# Patient Record
Sex: Female | Born: 1975 | Race: White | Hispanic: No | Marital: Married | Smoking: Never smoker
Health system: Southern US, Community
[De-identification: ages and names within clinical notes are randomized; demographics above are authoritative.]

## PROBLEM LIST (undated history)

## (undated) DIAGNOSIS — M533 Sacrococcygeal disorders, not elsewhere classified: Secondary | ICD-10-CM

## (undated) DIAGNOSIS — M549 Dorsalgia, unspecified: Secondary | ICD-10-CM

## (undated) HISTORY — DX: Sacrococcygeal disorders, not elsewhere classified: M53.3

## (undated) HISTORY — DX: Dorsalgia, unspecified: M54.9

---

## 2005-04-12 ENCOUNTER — Ambulatory Visit: Payer: Self-pay | Admitting: Sports Medicine

## 2006-04-22 ENCOUNTER — Emergency Department (HOSPITAL_COMMUNITY): Admission: EM | Admit: 2006-04-22 | Discharge: 2006-04-22 | Payer: Self-pay | Admitting: Family Medicine

## 2006-09-10 ENCOUNTER — Ambulatory Visit: Payer: Self-pay | Admitting: Family Medicine

## 2007-01-15 ENCOUNTER — Ambulatory Visit: Payer: Self-pay | Admitting: Family Medicine

## 2007-08-20 ENCOUNTER — Other Ambulatory Visit: Admission: RE | Admit: 2007-08-20 | Discharge: 2007-08-20 | Payer: Self-pay | Admitting: Obstetrics and Gynecology

## 2008-07-14 ENCOUNTER — Ambulatory Visit (HOSPITAL_COMMUNITY): Admission: RE | Admit: 2008-07-14 | Discharge: 2008-07-14 | Payer: Self-pay | Admitting: Chiropractic Medicine

## 2009-07-11 ENCOUNTER — Ambulatory Visit: Payer: Self-pay | Admitting: Sports Medicine

## 2009-07-11 DIAGNOSIS — M25519 Pain in unspecified shoulder: Secondary | ICD-10-CM

## 2009-07-11 DIAGNOSIS — M719 Bursopathy, unspecified: Secondary | ICD-10-CM

## 2009-07-11 DIAGNOSIS — M67919 Unspecified disorder of synovium and tendon, unspecified shoulder: Secondary | ICD-10-CM | POA: Insufficient documentation

## 2010-05-08 NOTE — Assessment & Plan Note (Signed)
Summary: R SHOULDER PAIN X 1 WK   Vital Signs:  Patient profile:   35 year old female Height:      65 inches Weight:      112 pounds BMI:     18.71 BP sitting:   119 / 76  Vitals Entered By: Lillia Pauls CMA (July 11, 2009 9:38 AM)  History of Present Illness: Pt presents with right shoulder pain that has been bothering her for the past week. She has a history of frozen shoulder and wants to make sure that she is not digressing back into having a frozen shoulder. She feels a "buzzing" feeling over her lateral shoulder in the deltoid medicine, especially in the morning when she wakes up. She is able to sleep well at night. Pain does radiation throughout her shoulder, especially when she does certain yoga positions. She is a Marine scientist. She also feels that her right shoulderblade moves more than her left one. She has taken aspirin once for the pain which was helpful. She has also iced her shoulder which was also helpful. She denies any numbness or tingling in her hands or shoulder.  Physical Exam  General:  alert and well-developed.   Head:  normocephalic and atraumatic.   Neck:  supple and full ROM.  No TTP over her cervical spine.  Lungs:  normal respiratory effort.   Msk:  Right Shoulder: Normal inspection Full ROM without pain or difficulty Neg drop arm test 5/5 strength with resisted internal and external rotation Neg empty can test, Hawkin's,cross over test, Neer's test and speed's test + TTP over her biceps tendon The distal portion of her medial scapula moves easily and has weakness No winging of her scapula  Left Shoulder: Normal inspection Full ROM without pain or difficulty Neg drop arm test 5/5 strength with resisted internal and external rotation Neg empty can test, Hawkin's,cross over test, Neer's test and speed's test No TTP throughout No winging of her scapula  Bighton score of 9/9   Impression & Recommendations:  Problem # 1:  SHOULDER PAIN  (ICD-719.41) Likley due to scapular instability and rotator cuff tendinitis 1. Given shoulder exercises to do daily including sacpular stabilizing exercises 2. Can use OTC NSAIDs as needed for pain 3. Can ice as needed for 20 minutes 4. 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 See above for treatment plan

## 2010-05-10 IMAGING — CR DG CERVICAL SPINE COMPLETE 4+V
6 series · 6 of 6 positions shown · non-contrast
Comparison: None.

CLINICAL DATA: Neck pain

CERVICAL SPINE - COMPLETE 4+ VIEW

[w c-spine lat *]
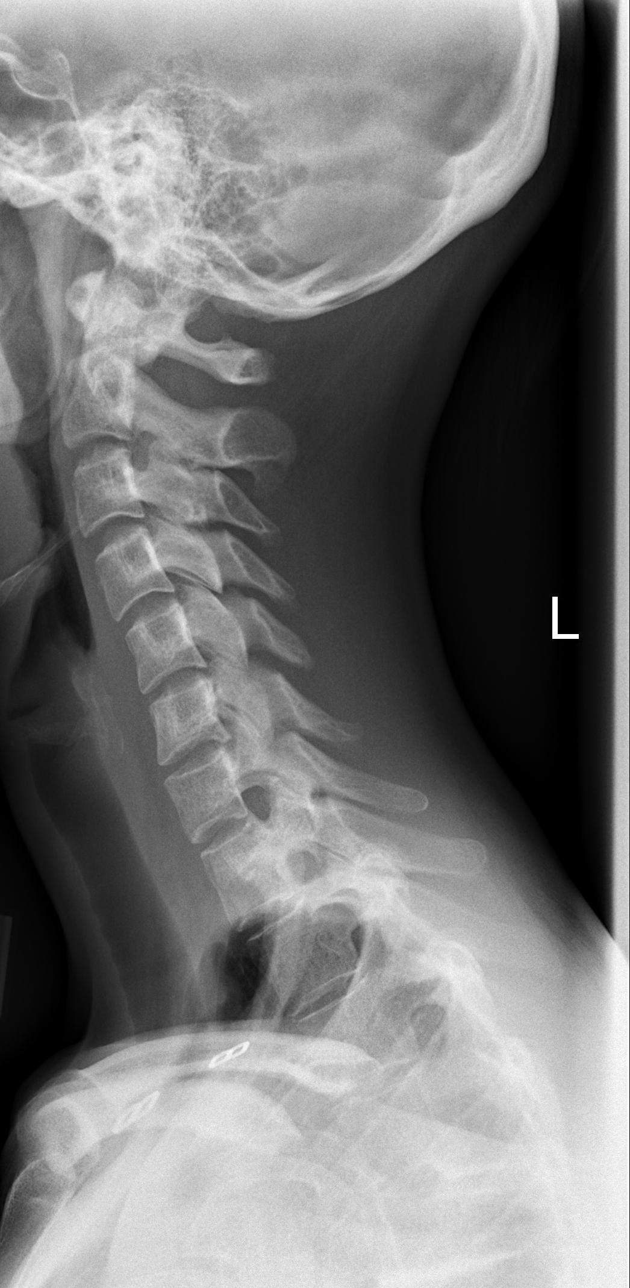

[w c-spine oblique (1 of 2)]
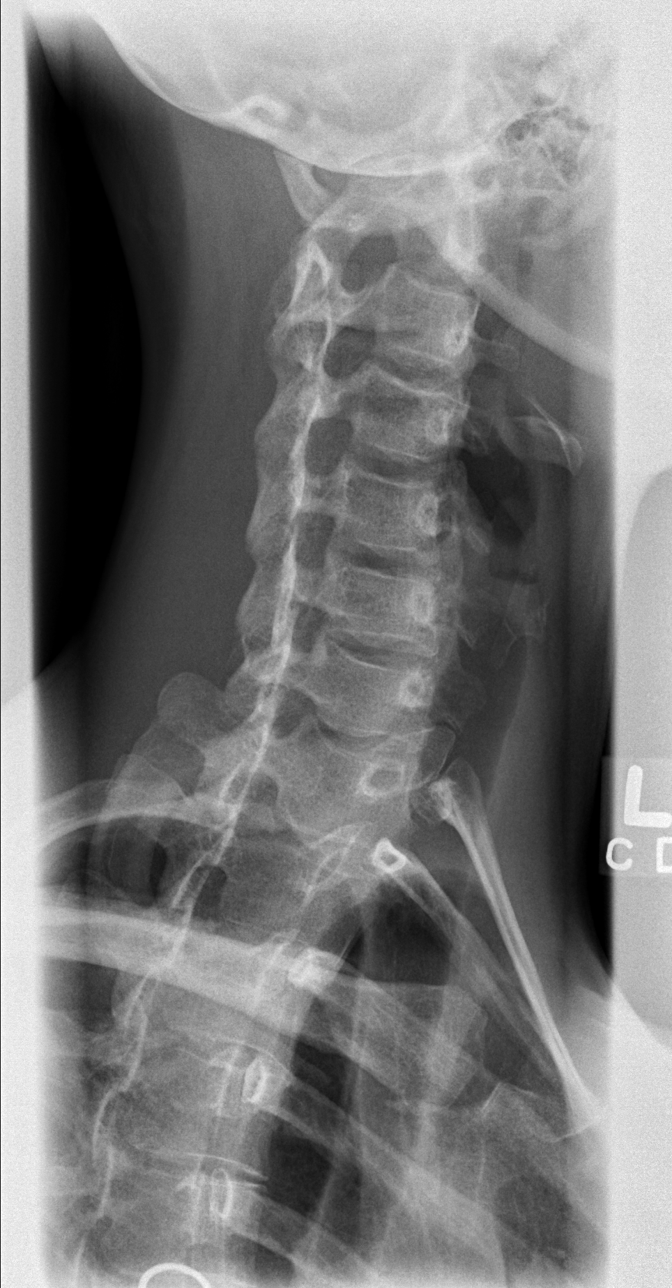

[w c-spine oblique (2 of 2)]
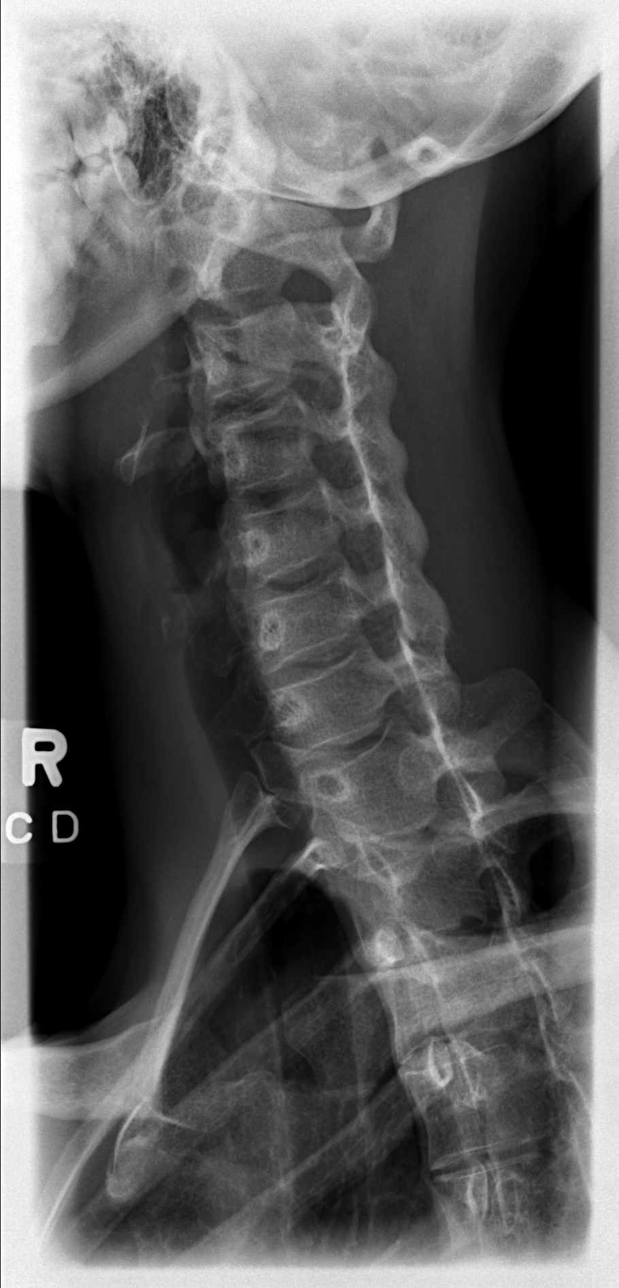

[w c-spine a.p. *]
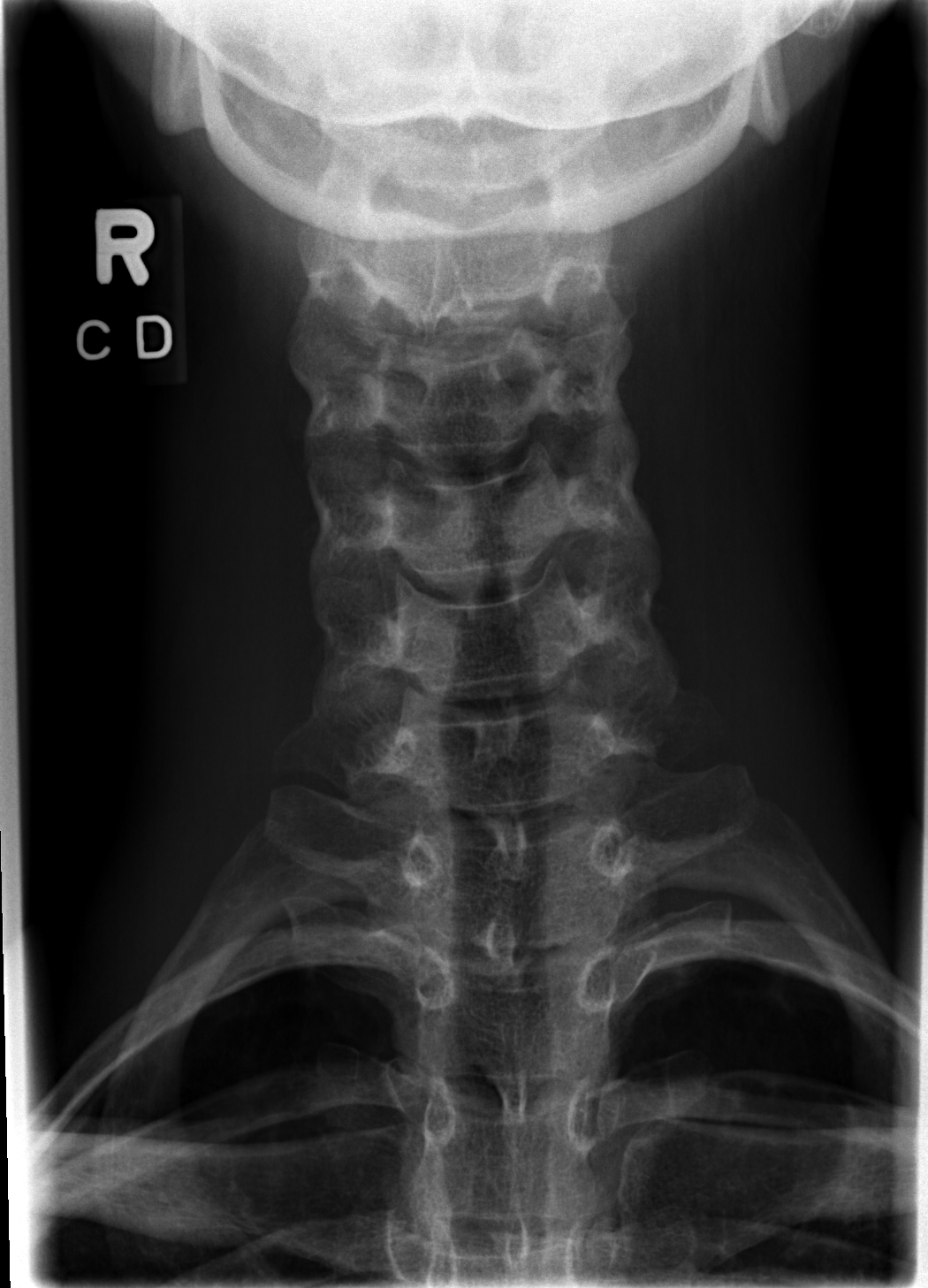

[w c-spine odontoid * (1 of 2)]
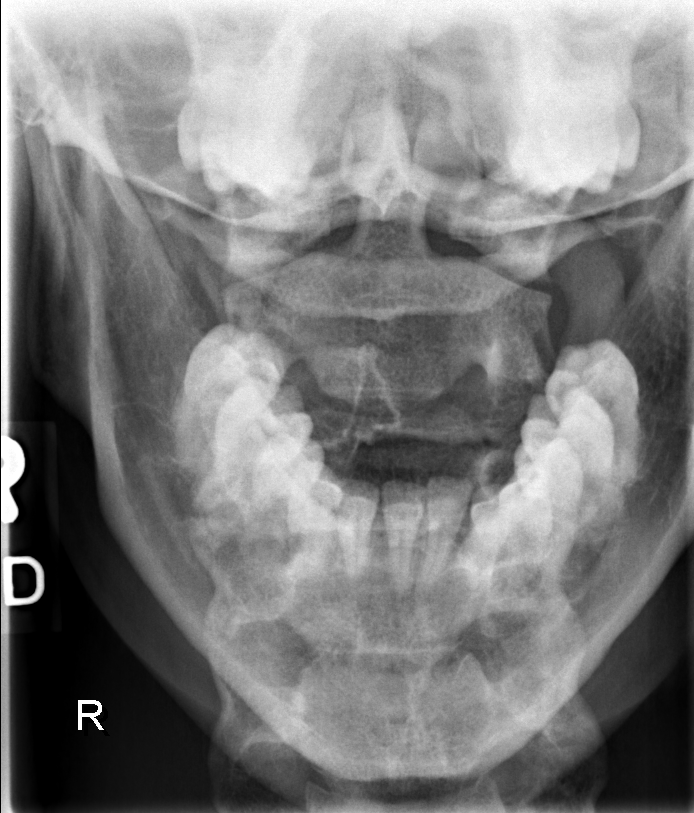

[w c-spine odontoid * (2 of 2)]
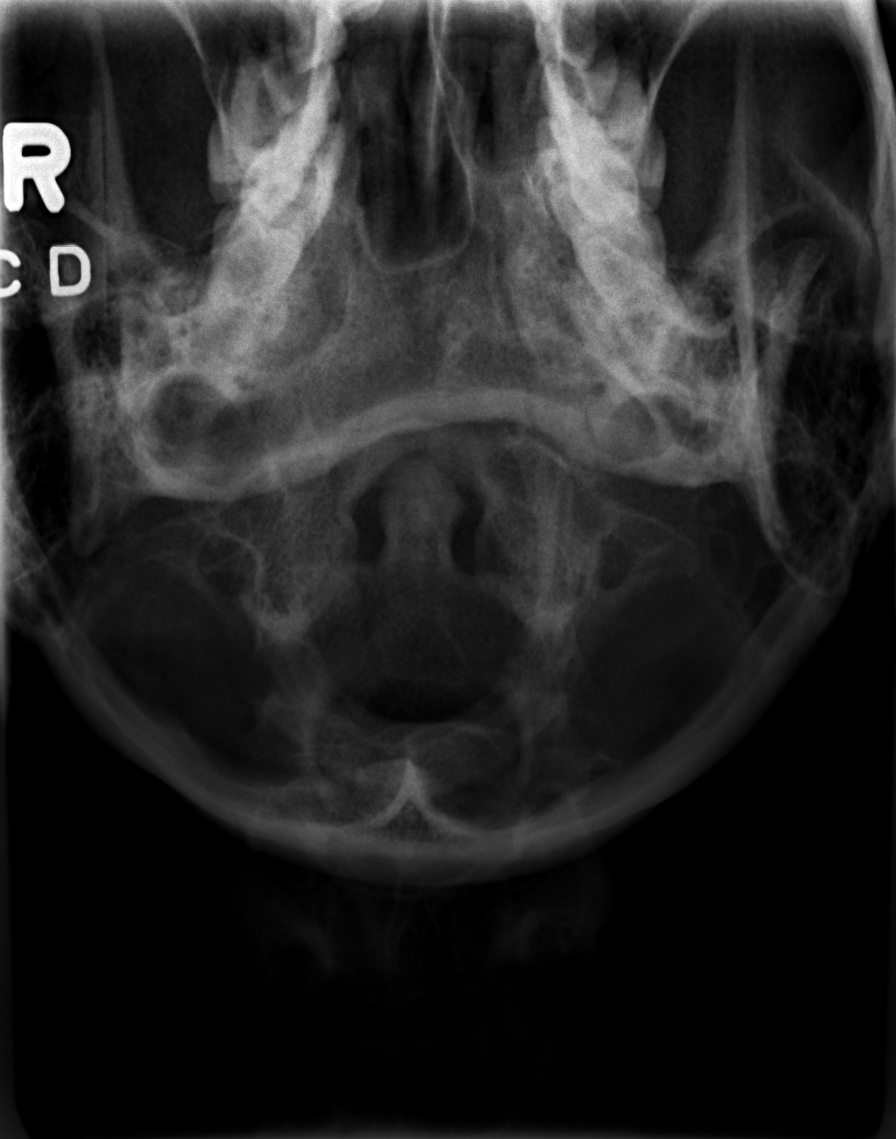

[6 of 6 positions shown; findings below may reference images not displayed]

FINDINGS: Mild degenerative change appears to be present at C6-7
where there is slight reversal of the normal cervical lordotic
curve, perhaps minimal retrolisthesis, and question of early
posterior osteophyte formation.  There is minimal neural foraminal
narrowing on the right.  No definite facet arthropathy is seen.
There is no prevertebral soft tissue swelling.  AP and odontoid
views are unremarkable.
IMPRESSION: Question mild degenerative change at C6-7.  Consider MRI for
further evaluation if there are no clinical contraindications.

## 2011-01-24 ENCOUNTER — Other Ambulatory Visit (HOSPITAL_COMMUNITY)
Admission: RE | Admit: 2011-01-24 | Discharge: 2011-01-24 | Disposition: A | Discharge status: Home / Self Care | Payer: BC Managed Care – PPO | Source: Ambulatory Visit | Attending: Family Medicine | Admitting: Family Medicine

## 2011-01-24 ENCOUNTER — Ambulatory Visit (INDEPENDENT_AMBULATORY_CARE_PROVIDER_SITE_OTHER): Payer: BC Managed Care – PPO | Admitting: Family Medicine

## 2011-01-24 ENCOUNTER — Encounter: Payer: Self-pay | Admitting: Family Medicine

## 2011-01-24 VITALS — BP 102/70 | HR 72 | Ht 65.0 in | Wt 115.0 lb

## 2011-01-24 DIAGNOSIS — N979 Female infertility, unspecified: Secondary | ICD-10-CM | POA: Insufficient documentation

## 2011-01-24 DIAGNOSIS — Z23 Encounter for immunization: Secondary | ICD-10-CM

## 2011-01-24 DIAGNOSIS — Z13220 Encounter for screening for lipoid disorders: Secondary | ICD-10-CM

## 2011-01-24 DIAGNOSIS — Z01419 Encounter for gynecological examination (general) (routine) without abnormal findings: Secondary | ICD-10-CM | POA: Insufficient documentation

## 2011-01-24 DIAGNOSIS — R5381 Other malaise: Secondary | ICD-10-CM

## 2011-01-24 DIAGNOSIS — Z0000 Encounter for general adult medical examination without abnormal findings: Secondary | ICD-10-CM

## 2011-01-24 DIAGNOSIS — R5383 Other fatigue: Secondary | ICD-10-CM

## 2011-01-24 HISTORY — DX: Female infertility, unspecified: N97.9

## 2011-01-24 LAB — POCT URINALYSIS DIPSTICK
Bilirubin, UA: NEGATIVE
Ketones, UA: NEGATIVE
Leukocytes, UA: NEGATIVE
Nitrite, UA: NEGATIVE
Protein, UA: NEGATIVE

## 2011-01-24 LAB — CBC WITH DIFFERENTIAL/PLATELET
Basophils Absolute: 0 10*3/uL (ref 0.0–0.1)
Basophils Relative: 1 % (ref 0–1)
Eosinophils Absolute: 0.1 10*3/uL (ref 0.0–0.7)
Lymphs Abs: 1.8 10*3/uL (ref 0.7–4.0)
MCH: 31.2 pg (ref 26.0–34.0)
Neutrophils Relative %: 44 % (ref 43–77)
Platelets: 266 10*3/uL (ref 150–400)
RBC: 4.2 MIL/uL (ref 3.87–5.11)
RDW: 12.6 % (ref 11.5–15.5)

## 2011-01-24 LAB — COMPREHENSIVE METABOLIC PANEL
ALT: 8 U/L (ref 0–35)
AST: 12 U/L (ref 0–37)
Alkaline Phosphatase: 35 U/L — ABNORMAL LOW (ref 39–117)
CO2: 25 mEq/L (ref 19–32)
Sodium: 138 mEq/L (ref 135–145)
Total Bilirubin: 0.8 mg/dL (ref 0.3–1.2)
Total Protein: 7.3 g/dL (ref 6.0–8.3)

## 2011-01-24 LAB — LIPID PANEL
LDL Cholesterol: 101 mg/dL — ABNORMAL HIGH (ref 0–99)
Total CHOL/HDL Ratio: 2.7 Ratio
VLDL: 9 mg/dL (ref 0–40)

## 2011-01-24 LAB — TSH: TSH: 1.984 u[IU]/mL (ref 0.350–4.500)

## 2011-01-24 NOTE — Progress Notes (Signed)
Alexandra Sutton is a 35 y.o. female who presents for a complete physical.  She has the following concerns: She has been trying to get pregnant for a year and a half.  Started checking her basal body temperatures.  Husband has talked to his doctor and plans to have sperm checked.  Admits to not having sex very frequently. She has family history of overactive thyroid and would like thyroid checked today.  Periods are regular  There is no immunization history on file for this patient. Last tetanus was likely in 2003 when in [employer] Last Pap smear: 1 year ago Last mammogram: never Last colonoscopy: never Last DEXA: never Dentist: twice yearly Ophtho: yearly Exercise: cardio twice a week, rides bike or walks to work, teaches yoga  Past Medical History  Diagnosis Date  . SI (sacroiliac) joint dysfunction     History reviewed. No pertinent past surgical history.  History   Social History  . Marital Status: Married    Spouse Name: N/A    Number of Children: N/A  . Years of Education: N/A   Occupational History  . Not on file.   Social History Main Topics  . Smoking status: Never Smoker   . Smokeless tobacco: Never Used  . Alcohol Use: Yes     2-3 drinks/week  . Drug Use: Yes     marijuana once monthly  . Sexually Active: Yes -- Female partner(s)   Other Topics Concern  . 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 Thyroid disease Mother   . Cancer Mother     kidney and lung  . Fibromyalgia Father   . Thyroid disease Maternal Uncle   . Thyroid disease Maternal Grandmother   . Diabetes Neg Hx   . Heart disease Neg Hx   . Depression Sister    No current outpatient prescriptions on file.  No Known Allergies  ROS: The patient denies anorexia, fever, weight changes, headaches,  vision changes, decreased hearing, ear pain,  sore throat, breast concerns, chest pain, palpitations, dizziness, syncope, dyspnea on exertion, cough, swelling, nausea, vomiting, diarrhea, constipation, abdominal pain, melena, hematochezia, indigestion/heartburn, hematuria, incontinence, dysuria, irregular menstrual cycles, vaginal discharge, odor or itch, genital lesions, joint pains, numbness, tingling, weakness, tremor, suspicious skin lesions, depression, anxiety, abnormal bleeding/bruising, or enlarged lymph nodes.  Uses accupuncture frequently for any concerns  PHYSICAL EXAM: BP 102/70  Pulse 72  Ht 5\' 5"  (1.651 m)  Wt 115 lb (52.164 kg)  BMI 19.14 kg/m2  General Appearance:    Alert, cooperative, no distress, appears stated age  Head:    Normocephalic, without obvious abnormality, atraumatic  Eyes:    PERRL, conjunctiva/corneas clear, EOM's intact, fundi    benign  Ears:    Normal TM's and external ear canals  Nose:   Nares normal, mucosa normal, no drainage or sinus   tenderness  Throat:   Lips, mucosa, and tongue normal; teeth and gums normal  Neck:   Supple, no lymphadenopathy;  thyroid:  no   enlargement/tenderness/nodules; no carotid   bruit or JVD  Back:    Spine nontender, no curvature, ROM normal, no CVA     tenderness  Lungs:     Clear to auscultation bilaterally without wheezes, rales or     ronchi; respirations unlabored  Chest Wall:    No tenderness or deformity  Heart:    Regular rate and rhythm, S1 and S2 normal, no murmur, rub   or gallop  Breast Exam:    No tenderness, masses, or nipple discharge or inversion.      No axillary lymphadenopathy  Abdomen:     Soft, non-tender, nondistended, normoactive bowel sounds,    no masses, no hepatosplenomegaly  Genitalia:    Normal external genitalia without lesions.  BUS and vagina normal; cervix without lesions, or cervical motion tenderness. No abnormal vaginal discharge.  Uterus and adnexa not enlarged, nontender, no masses.  Pap performed  Rectal:    Not performed due  to age<40 and no related complaints  Extremities:   No clubbing, cyanosis or edema  Pulses:   2+ and symmetric all extremities  Skin:   Skin color, texture, turgor normal, no rashes or lesions  Lymph nodes:   Cervical, supraclavicular, and axillary nodes normal  Neurologic:   CNII-XII intact, normal strength, sensation and gait; reflexes 2+ and symmetric throughout          Psych:   Normal mood, affect, hygiene and grooming.    ASSESSMENT/PLAN: 1. Physical exam, routine  POCT Urinalysis Dipstick, Cytology - PAP  2. Infertility, female  TSH  3. Screening for lipoid disorders  Lipid panel  4. Other malaise and fatigue  Vitamin D 25 hydroxy, CBC with Differential, Comprehensive metabolic panel  5. Need for Tdap vaccination  Tdap vaccine greater than or equal to 7yo IM   Discussed monthly self breast exams and yearly mammograms after the age of 3; at least 30 minutes of aerobic activity at least 5 days/week; proper sunscreen use reviewed; healthy diet, including goals of calcium and vitamin D intake and alcohol recommendations (less than or equal to 1 drink/day) reviewed; regular seatbelt use; changing batteries in smoke detectors.  Immunization recommendations discussed--TdaP give.  Flu shot encouraged but pt declined.  Colonoscopy recommendations reviewed--age 33

## 2011-01-24 NOTE — Patient Instructions (Addendum)
HEALTH MAINTENANCE RECOMMENDATIONS:  It is recommended that you get at least 30 minutes of aerobic exercise at least 5 days/week (for weight loss, you may need as much as 60-90 minutes). This can be any activity that gets your heart rate up. This can be divided in 10-15 minute intervals if needed, but try and build up your endurance at least once a week.  Weight bearing exercise is also recommended twice weekly.  Eat a healthy diet with lots of vegetables, fruits and fiber.  "Colorful" foods have a lot of vitamins (ie green vegetables, tomatoes, red peppers, etc).  Limit sweet tea, regular sodas and alcoholic beverages, all of which has a lot of calories and sugar.  Up to 1 alcoholic drink daily may be beneficial for women (unless trying to lose weight, watch sugars).  Drink a lot of water.  Calcium recommendations are 1200-1500 mg daily (1500 mg for postmenopausal women or women without ovaries), and vitamin D 1000 IU daily.  This should be obtained from diet and/or supplements (vitamins), and calcium should not be taken all at once, but in divided doses.  Monthly self breast exams and yearly mammograms for women over the age of 72 is recommended.  Sunscreen of at least SPF 30 should be used on all sun-exposed parts of the skin when outside between the hours of 10 am and 4 pm (not just when at beach or pool, but even with exercise, golf, tennis, and yard work!)  Use a sunscreen that says "broad spectrum" so it covers both UVA and UVB rays, and make sure to reapply every 1-2 hours.  Remember to change the batteries in your smoke detectors when changing your clock times in the spring and fall.  Use your seat belt every time you are in a car, and please drive safely and not be distracted with cell phones and texting while driving.  I recommend that you follow up with a gynecologist for further evaluation of infertility (pending your husband's evaluation)

## 2011-01-25 NOTE — Progress Notes (Signed)
Advised pt of labs & vit d

## 2011-01-28 ENCOUNTER — Encounter: Payer: Self-pay | Admitting: Family Medicine

## 2011-11-07 DIAGNOSIS — M549 Dorsalgia, unspecified: Secondary | ICD-10-CM | POA: Insufficient documentation

## 2011-11-07 HISTORY — DX: Dorsalgia, unspecified: M54.9

## 2014-01-31 ENCOUNTER — Encounter: Payer: Self-pay | Admitting: Family Medicine

## 2014-01-31 ENCOUNTER — Ambulatory Visit (INDEPENDENT_AMBULATORY_CARE_PROVIDER_SITE_OTHER): Payer: BC Managed Care – PPO | Admitting: Family Medicine

## 2014-01-31 VITALS — BP 118/70 | HR 80 | Ht 65.0 in | Wt 117.0 lb

## 2014-01-31 DIAGNOSIS — M62838 Other muscle spasm: Secondary | ICD-10-CM

## 2014-01-31 DIAGNOSIS — M542 Cervicalgia: Secondary | ICD-10-CM

## 2014-01-31 DIAGNOSIS — M6248 Contracture of muscle, other site: Secondary | ICD-10-CM

## 2014-01-31 MED ORDER — NAPROXEN 500 MG PO TABS: 500.0000 mg | ORAL_TABLET | Freq: Two times a day (BID) | ORAL | Status: DC

## 2014-01-31 MED ORDER — CYCLOBENZAPRINE HCL 10 MG PO TABS: 5.0000 mg | ORAL_TABLET | Freq: Three times a day (TID) | ORAL | Status: DC | PRN

## 2014-01-31 NOTE — Patient Instructions (Signed)
Take your prescribed anti-inflammatory medication with food; discontinue or cut back the dose if you develop stomach pain/discomfort/side effects.  Do not take other over-the-counter pain medications such as ibuprofen, advil, motrin, aleve, naproxen at the same time.  Do not use longer than recommended.  It is okay to use acetaminophen (tylenol) along with this medication.  Take the muscle relaxant just at bedtime, as it is likely very sedating.  Start with 1/2 tablet, and if ineffective, you can increase to the full tablet.  Torticollis, Acute You have suddenly (acutely) developed a twisted neck (torticollis). This is usually a self-limited condition. CAUSES  Acute torticollis may be caused by malposition, trauma or infection. Most commonly, acute torticollis is caused by sleeping in an awkward position. Torticollis may also be caused by the flexion, extension or twisting of the neck muscles beyond their normal position. Sometimes, the exact cause may not be known. SYMPTOMS  Usually, there is pain and limited movement of the neck. Your neck may twist to one side. DIAGNOSIS  The diagnosis is often made by physical examination. X-rays, CT scans or MRIs may be done if there is a history of trauma or concern of infection. TREATMENT  For a common, stiff neck that develops during sleep, treatment is focused on relaxing the contracted neck muscle. Medications (including shots) may be used to treat the problem. Most cases resolve in several days. Torticollis usually responds to conservative physical therapy. If left untreated, the shortened and spastic neck muscle can cause deformities in the face and neck. Rarely, surgery is required. HOME CARE INSTRUCTIONS   Use over-the-counter and prescription medications as directed by your caregiver.  Do stretching exercises and massage the neck as directed by your caregiver.  Follow up with physical therapy if needed and as directed by your caregiver. SEEK  IMMEDIATE MEDICAL CARE IF:   You develop difficulty breathing or noisy breathing (stridor).  You drool, develop trouble swallowing or have pain with swallowing.  You develop numbness or weakness in the hands or feet.  You have changes in speech or vision.  You have problems with urination or bowel movements.  You have difficulty walking.  You have a fever.  You have increased pain. MAKE SURE YOU:   Understand these instructions.  Will watch your condition.  Will get help right away if you are not doing well or get worse. Document Released: 03/22/2000 Document Revised: 06/17/2011 Document Reviewed: 05/03/2009 [facility] Patient Information 2015 [place], [place]. This information is not intended to replace advice given to you by your health care provider. Make sure you discuss any questions you have with your health care provider.

## 2014-01-31 NOTE — Progress Notes (Signed)
Chief Complaint  Patient presents with  . Neck Pain    started Oct 14th, gets slightly better as the day goes on but when she awakens in the morning it is at its worst. Pain is on the right side of her neck and down her shoulder. Patient declined flu vaccine.    After returning from trip to [place] on 10/13, the following morning she woke up with her right side of her neck "frozen".  She wasn't able to turn to the right, had pain with all movements.  She has had this in the past, but usually resolves in 2-3 days.  This has persisted.  It feels worse in the morning, improves some later in the day. Can't look completely to the right, limited forward flexion.  Pain radiates down to her scapula.  Denies any numbness/tingling into right arm (notes some intermittently in her left foot).  She has some pain at her elbow.  She denies any weakness.  She has tried massage, which helped temporarily.  She rubs Magnesium oil on it, has tried heat.  She hasn't tried any NSAIDs.  Pain can interfere with sleep, is better if she sleeps on her left side.  She hasn't been seen in this office in over 3 years.  In the interim she had a son, and developed problems with mid and low back pain after having him. She had a prolonged course of PT, and while much better, still has intermittent pain.  Past Medical History  Diagnosis Date  . SI (sacroiliac) joint dysfunction   . Back pain 11/2011    low and mid-back s/p pregnancy; improved with PT   No past surgical history on file. History   Social History  . Marital Status: Married    Spouse Name: N/A    Number of Children: N/A  . Years of Education: N/A   Occupational History  . Not on file.   Social History Main Topics  . Smoking status: Never Smoker   . Smokeless tobacco: Never Used  . Alcohol Use: Yes     Comment: 1 drink /week  . Drug Use: No     Comment: none since 2013 (prior marijuana)  . Sexual Activity: Yes    Partners: Male    Birth Control/  Protection: Condom   Other Topics Concern  . 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   Married, 1 son, 2 cats, chickens   Family History  Problem Relation Age of Onset  . Thyroid disease Mother   . Cancer Mother     kidney and lung  . Fibromyalgia Father   . Thyroid disease Maternal Uncle   . Thyroid disease Maternal Grandmother   . Diabetes Neg Hx   . Heart disease Neg Hx   . Depression Sister    Outpatient Encounter Prescriptions as of 01/31/2014  Medication Sig Note  . MAGNESIUM PO Take 1 tablet by mouth daily.   . Omega-3 Fatty Acids (FISH OIL PO) Take 1 capsule by mouth daily.   . cyclobenzaprine (FLEXERIL) 10 MG tablet Take 0.5-1 tablets (5-10 mg total) by mouth 3 (three) times daily as needed for muscle spasms.   . naproxen (NAPROSYN) 500 MG tablet Take 1 tablet (500 mg total) by mouth 2 (two) times daily with a meal.   . Prenatal MV-Min-Fe Fum-FA-DHA (PRENATAL 1 PO) Take 1 tablet by mouth daily. 01/31/2014: Recently ran out  (naproxen and flexeril were  rx'd today, not prior to visit).  No Known Allergies  ROS:  Denies fever, chills, URi symptoms, allergie, weight changes, headaches, chest pain, numbness, tingling, weakness, bleeding, bruising, GI or GU complaints.  No other joint pains except as noted in HPI.  Moods are good.  PHYSICAL EXAM: BP 118/70  Pulse 80  Ht 5\' 5"  (1.651 m)  Wt 117 lb (53.071 kg)  BMI 19.47 kg/m2  LMP 01/14/2014  Well developed, pleasant female, in no distress HEENT: PERRL, EOMI, conjunctive clear.  Neck: no lymphadenopathy, thyromegaly.  No c-spine tenderness.  +tightness in trapezius and paraspinous muscles bilaterally, although is only tender on the right.  She has mild tenderness in the right SCM also.  No significant spasm or asymmetry noted. Heart: regular rate and rhythm without murmur Lungs: clear bilaterally Abdomen: soft, nontender, no  organomegaly or mass Back: no spinal tenderness.  No CVA tenderness. She does have tenderness at right rhomboid muscles; some tightness noted bilaterally Extremities: no edema, 2+ pulses Neuro: alert and oriented.  Cranial nerves intact.  Strength 5/5 in upper extremities.  2+ DTR's. Normal sensation Psych: normal mood, affect, hygiene and grooming Skin: no rashes, bruising, lesions  ASSESSMENT/PLAN:  Neck pain on right side - Plan: naproxen (NAPROSYN) 500 MG tablet  Muscle spasms of neck - Plan: cyclobenzaprine (FLEXERIL) 10 MG tablet  NSAID precautions were reviewed with the patient.  Patient should take medication with food, discontinue if develops GI side effects, not to take other OTC NSAIDs at the same time, and not to use longer than recommended.  Discussed heat, massage. She was instructed on proper stretches. Risks/side effects of medications were reviewed in detail. To take naproxen BID for at least 7-10 days (until pain completely resolves). Use flexeril 1/2-1 just at bedtime prn. Discussed proper posture when at the computer, pillow/neck roll when sleeping.  Return if increasing pain, numbness, tingling or weakness develops.  Declines flu shot

## 2014-02-07 ENCOUNTER — Encounter: Payer: Self-pay | Admitting: Family Medicine

## 2015-05-07 ENCOUNTER — Encounter (HOSPITAL_COMMUNITY): Payer: Self-pay | Admitting: Emergency Medicine

## 2015-05-07 ENCOUNTER — Emergency Department (INDEPENDENT_AMBULATORY_CARE_PROVIDER_SITE_OTHER): Payer: BLUE CROSS/BLUE SHIELD

## 2015-05-07 ENCOUNTER — Emergency Department (INDEPENDENT_AMBULATORY_CARE_PROVIDER_SITE_OTHER)
Admission: EM | Admit: 2015-05-07 | Discharge: 2015-05-07 | Disposition: A | Discharge status: Home / Self Care | Payer: BLUE CROSS/BLUE SHIELD | Source: Home / Self Care | Attending: Emergency Medicine | Admitting: Emergency Medicine

## 2015-05-07 DIAGNOSIS — R6889 Other general symptoms and signs: Secondary | ICD-10-CM | POA: Diagnosis not present

## 2015-05-07 NOTE — ED Notes (Signed)
Pt here with possible acute URI/ Sinus sx's that started 4 days ago Generalized body aches, chills, nasal congestion with slight facial pressure, diarrhea and decrease appetite Taking herbal chinese meds without relief, Ibuprofen effective for one day LMP 04/25/15

## 2015-05-07 NOTE — Discharge Instructions (Signed)
You probably have the flu. Make sure you get plenty of rest and drink plenty of fluids. You can take an over-the-counter allergy medicine such as Zyrtec, Claritin, or Allegra to help with the congestion. He should start to feel better in the next 2-3 days. If your symptoms are getting worse or just not improving, please follow-up with your primary care doctor or here for repeat evaluation.

## 2015-05-07 NOTE — ED Provider Notes (Signed)
CSN: [id number]     Arrival date & time 05/07/15  1557 History   First MD Initiated Contact with Patient 05/07/15 1626     Chief Complaint  Patient presents with  . URI  . Sinus Problem   (Consider location/radiation/quality/duration/timing/severity/associated sxs/prior Treatment) HPI She is a 40 year old woman here for evaluation of cough and fever. She states she started feeling poorly on Thursday. She took some ibuprofen which did temporarily help. She reports subjective fevers, body aches, chills, loss of appetite, and diarrhea for the last 4 days. Symptoms tend to be better in the morning and worse in the afternoon. Today, she developed a sore throat and nasal congestion with some sinus pressure and sinus headache. No vomiting. No shortness of breath or chest pain. Cough is intermittently productive. She has been taking herbal remedies without improvement. She did not get a flu shot this year.  Past Medical History  Diagnosis Date  . SI (sacroiliac) joint dysfunction   . Back pain 11/2011    low and mid-back s/p pregnancy; improved with PT   History reviewed. No pertinent past surgical history. Family History  Problem Relation Age of Onset  . Thyroid disease Mother   . Cancer Mother     kidney and lung  . Fibromyalgia Father   . Thyroid disease Maternal Uncle   . Thyroid disease Maternal Grandmother   . Diabetes Neg Hx   . Heart disease Neg Hx   . Depression Sister    Social History  Substance Use Topics  . Smoking status: Never Smoker   . Smokeless tobacco: Never Used  . Alcohol Use: Yes     Comment: 1 drink /week   OB History    Gravida Para Term Preterm AB TAB SAB Ectopic Multiple Living   1 1        1      Review of Systems As in history of present illness Allergies  Review of patient's allergies indicates no known allergies.  Home Medications   Prior to Admission medications   Medication Sig Start Date End Date Taking? Authorizing Provider  cyclobenzaprine  (FLEXERIL) 10 MG tablet Take 0.5-1 tablets (5-10 mg total) by mouth 3 (three) times daily as needed for muscle spasms. 01/31/14   Rita Ohara, MD  MAGNESIUM PO Take 1 tablet by mouth daily.    Historical Provider, MD  naproxen (NAPROSYN) 500 MG tablet Take 1 tablet (500 mg total) by mouth 2 (two) times daily with a meal. 01/31/14   Rita Ohara, MD  Omega-3 Fatty Acids (FISH OIL PO) Take 1 capsule by mouth daily.    Historical Provider, MD  Prenatal MV-Min-Fe Fum-FA-DHA (PRENATAL 1 PO) Take 1 tablet by mouth daily.    Historical Provider, MD   Meds Ordered and Administered this Visit  Medications - No data to display  BP 147/83 mmHg  Pulse 95  Temp(Src) 99.5 F (37.5 C) (Oral)  SpO2 98%  LMP 04/25/2015 No data found.   Physical Exam  Constitutional: She is oriented to person, place, and time. She appears well-developed and well-nourished. No distress.  HENT:  Mouth/Throat: Oropharynx is clear and moist. No oropharyngeal exudate.  Nasal discharge present. Nasal mucosa is erythematous. TMs normal bilaterally.  Eyes: Conjunctivae are normal.  Neck: Neck supple.  Cardiovascular: Normal rate, regular rhythm and normal heart sounds.   No murmur heard. Pulmonary/Chest: Effort normal and breath sounds normal. No respiratory distress. She has no wheezes. She has no rales.  Lymphadenopathy:    She has  cervical adenopathy.  Neurological: She is alert and oriented to person, place, and time.    ED Course  Procedures (including critical care time)  Labs Review Labs Reviewed - No data to display  Imaging Review Dg Chest 2 View  05/07/2015  CLINICAL DATA:  40 year old presenting with 4 day history of cough, fever, chills and generalized body aches. No significant past medical history. EXAM: CHEST  2 VIEW COMPARISON:  None. FINDINGS: Cardiomediastinal silhouette unremarkable. Mild hyperinflation is likely related to an excellent inspiratory effort. Lungs clear. Bronchovascular markings normal.  Pulmonary vascularity normal. No visible pleural effusions. No pneumothorax. Visualized bony thorax intact. IMPRESSION: No acute cardiopulmonary disease. Electronically Signed   By: Evangeline Dakin M.D.   On: 05/07/2015 17:11      MDM   1. Flu-like symptoms    Chest x-ray negative. Discussed symptomatic treatments. Follow-up if symptoms worsen or do not improve over the next several days.    Melony Overly, MD 05/07/15 1723

## 2016-03-11 ENCOUNTER — Ambulatory Visit (HOSPITAL_COMMUNITY)
Admission: RE | Admit: 2016-03-11 | Discharge: 2016-03-11 | Disposition: A | Discharge status: Home / Self Care | Payer: BLUE CROSS/BLUE SHIELD | Source: Ambulatory Visit | Attending: Advanced Practice Midwife | Admitting: Advanced Practice Midwife

## 2016-03-11 ENCOUNTER — Telehealth: Payer: Self-pay | Admitting: Advanced Practice Midwife

## 2016-03-11 ENCOUNTER — Other Ambulatory Visit: Payer: Self-pay | Admitting: Advanced Practice Midwife

## 2016-03-11 DIAGNOSIS — R109 Unspecified abdominal pain: Secondary | ICD-10-CM | POA: Diagnosis present

## 2016-03-11 DIAGNOSIS — Z3A08 8 weeks gestation of pregnancy: Secondary | ICD-10-CM | POA: Diagnosis not present

## 2016-03-11 DIAGNOSIS — O3481 Maternal care for other abnormalities of pelvic organs, first trimester: Secondary | ICD-10-CM | POA: Diagnosis not present

## 2016-03-11 DIAGNOSIS — O26891 Other specified pregnancy related conditions, first trimester: Secondary | ICD-10-CM | POA: Diagnosis not present

## 2016-03-11 DIAGNOSIS — D251 Intramural leiomyoma of uterus: Secondary | ICD-10-CM | POA: Insufficient documentation

## 2016-03-11 DIAGNOSIS — O3411 Maternal care for benign tumor of corpus uteri, first trimester: Secondary | ICD-10-CM | POA: Insufficient documentation

## 2016-03-11 DIAGNOSIS — O26899 Other specified pregnancy related conditions, unspecified trimester: Secondary | ICD-10-CM

## 2016-03-11 DIAGNOSIS — N8311 Corpus luteum cyst of right ovary: Secondary | ICD-10-CM | POA: Diagnosis not present

## 2016-03-11 NOTE — Telephone Encounter (Signed)
S: Pt is 8 weeks by LMP. No [place] or labs that pregnancy. Hasn't started care. Reports pain on right side of uterus x 4 days. Pain is mild-moderate--worse w/ palpation. Denies bleeding, fever, chills, N/V/D/C, discharge.   O: Pt is in NAD. Fundus 2/SP, mild-mod TTP right side of uterus. No guarding, rebound tenderness.   A:1. Abd pain in pregnancy     2. S>D  P: Will schedule STAT US today.  [place], [place] 03/11/2016 3:39 PM

## 2017-03-02 IMAGING — DX DG CHEST 2V
2 series · 2 of 2 positions shown · non-contrast
Comparison: None.

CLINICAL DATA: 39-year-old presenting with 4 day history of cough,
fever, chills and generalized body aches. No significant past
medical history.

EXAM:
CHEST  2 VIEW

[chest pa]
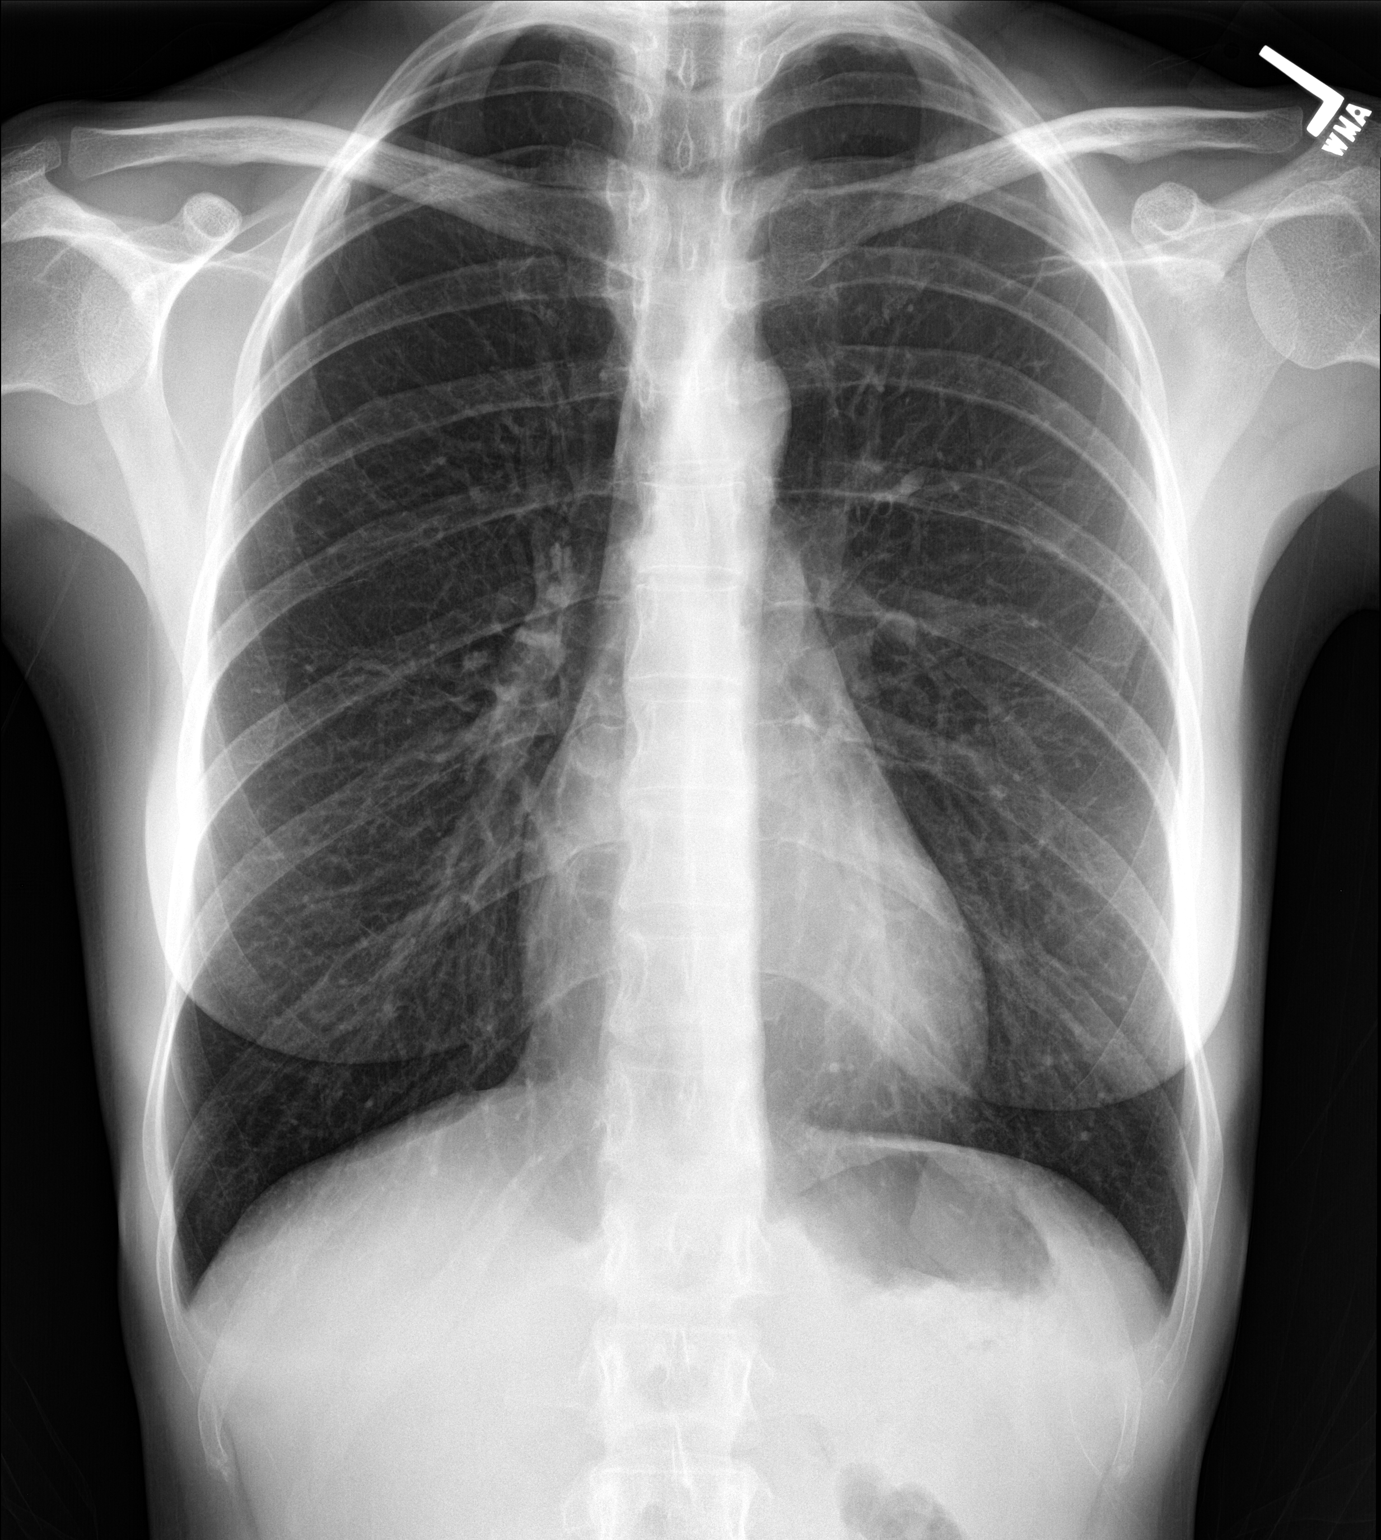

[chest lat]
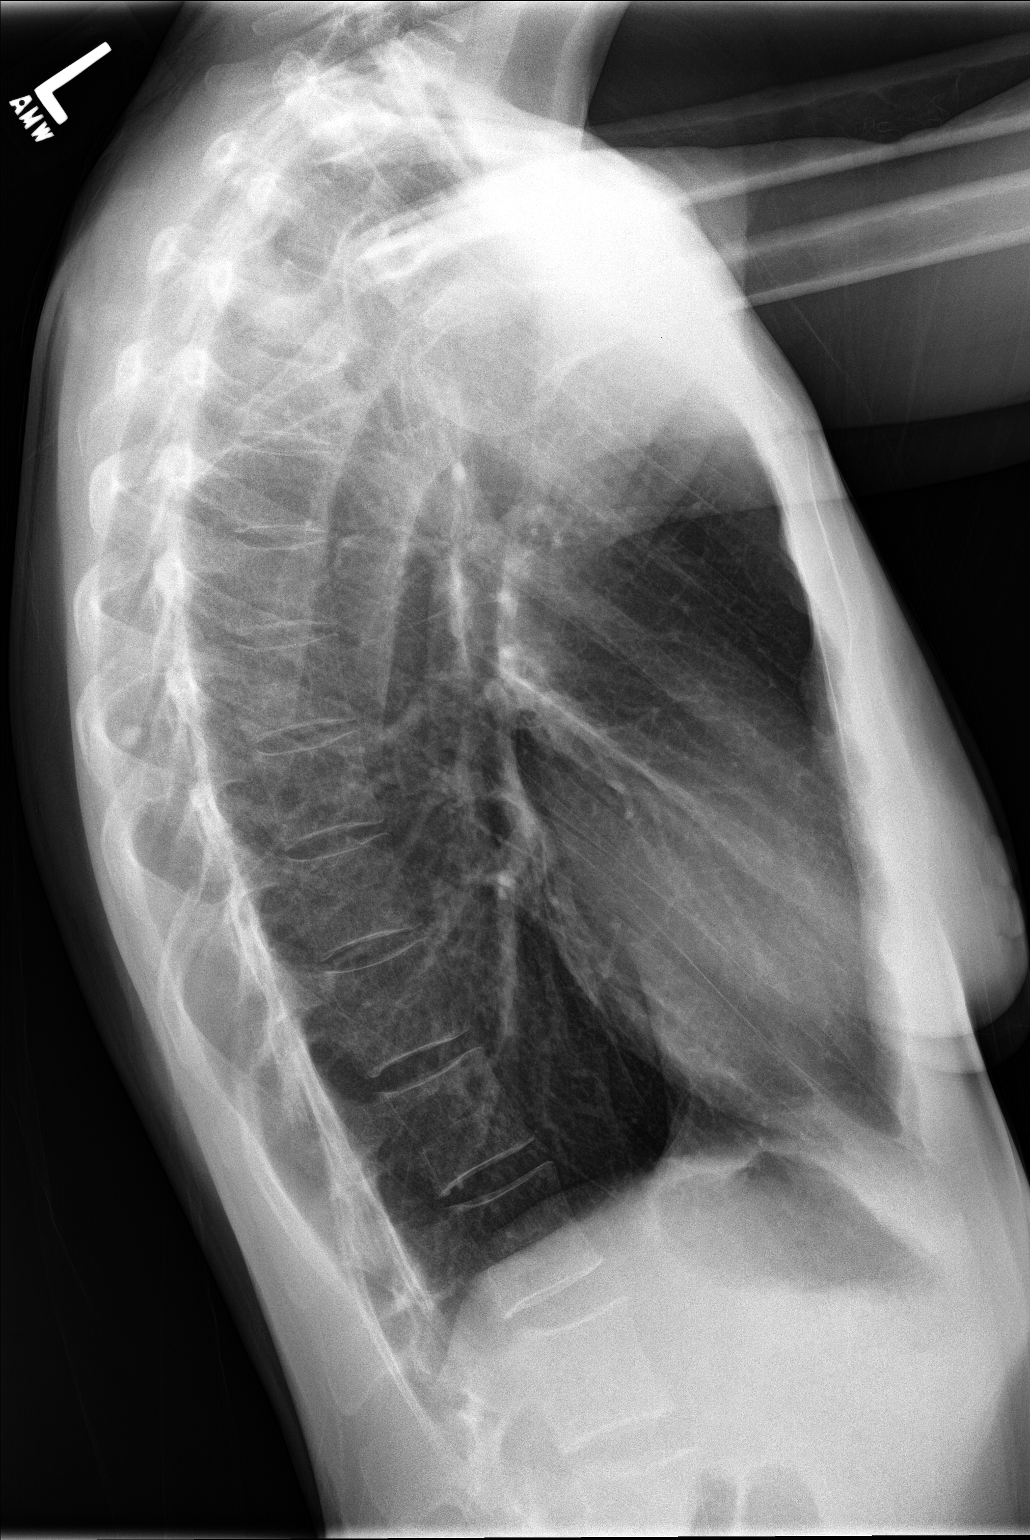

[2 of 2 positions shown; findings below may reference images not displayed]

FINDINGS: Cardiomediastinal silhouette unremarkable. Mild hyperinflation is
likely related to an excellent inspiratory effort. Lungs clear.
Bronchovascular markings normal. Pulmonary vascularity normal. No
visible pleural effusions. No pneumothorax. Visualized bony thorax
intact.
IMPRESSION: No acute cardiopulmonary disease.

## 2017-07-03 ENCOUNTER — Other Ambulatory Visit (INDEPENDENT_AMBULATORY_CARE_PROVIDER_SITE_OTHER): Payer: Self-pay

## 2017-07-03 DIAGNOSIS — Z1211 Encounter for screening for malignant neoplasm of colon: Secondary | ICD-10-CM

## 2017-07-03 LAB — HEMOCCULT GUIAC POC 1CARD (OFFICE)
Card #3 Fecal Occult Blood, POC: NEGATIVE
FECAL OCCULT BLD: NEGATIVE
FECAL OCCULT BLD: NEGATIVE

## 2017-08-19 ENCOUNTER — Other Ambulatory Visit: Payer: Self-pay | Admitting: Certified Nurse Midwife

## 2017-08-19 DIAGNOSIS — N6310 Unspecified lump in the right breast, unspecified quadrant: Secondary | ICD-10-CM

## 2017-08-20 ENCOUNTER — Ambulatory Visit
Admission: RE | Admit: 2017-08-20 | Discharge: 2017-08-20 | Disposition: A | Discharge status: Home / Self Care | Payer: BLUE CROSS/BLUE SHIELD | Source: Ambulatory Visit | Attending: Certified Nurse Midwife | Admitting: Certified Nurse Midwife

## 2017-08-20 DIAGNOSIS — N6310 Unspecified lump in the right breast, unspecified quadrant: Secondary | ICD-10-CM

## 2019-04-13 ENCOUNTER — Ambulatory Visit: Payer: Self-pay | Attending: Internal Medicine

## 2019-04-13 DIAGNOSIS — Z20822 Contact with and (suspected) exposure to covid-19: Secondary | ICD-10-CM

## 2019-04-15 LAB — NOVEL CORONAVIRUS, NAA: SARS-CoV-2, NAA: NOT DETECTED

## 2020-04-20 ENCOUNTER — Ambulatory Visit: Payer: Self-pay | Attending: Internal Medicine

## 2020-04-20 DIAGNOSIS — Z23 Encounter for immunization: Secondary | ICD-10-CM

## 2020-04-20 NOTE — Progress Notes (Signed)
   Covid-19 Vaccination Clinic  Name:  Alexandra Sutton    MRN: [id number] DOB: 1975/07/14  04/20/2020  [name] was observed post Covid-19 immunization for 15 minutes without incident. She was provided with Vaccine Information Sheet and instruction to access the V-Safe system.   [name] was instructed to call 911 with any severe reactions post vaccine: Marland Kitchen Difficulty breathing  . Swelling of face and throat  . A fast heartbeat  . A bad rash all over body  . Dizziness and weakness   Immunizations Administered    Name Date Dose VIS Date Route   Pfizer COVID-19 Vaccine 04/20/2020  4:51 PM 0.3 mL 01/26/2020 Intramuscular   Manufacturer: [place]   Lot: [id number]   NDC: [address]-4

## 2020-05-01 ENCOUNTER — Other Ambulatory Visit: Payer: Self-pay | Admitting: Obstetrics & Gynecology

## 2020-05-23 ENCOUNTER — Other Ambulatory Visit: Payer: Self-pay | Admitting: Obstetrics & Gynecology

## 2020-06-05 NOTE — Progress Notes (Signed)
Surgical Instructions    Your procedure is scheduled on 06/08/20.  Report to [facility] Main Entrance "A" at 5:30 A.M., then check in with the Admitting office.  Call this number if you have problems the morning of surgery:  [phone]   If you have any questions prior to your surgery date call [phone]: Open Monday-Friday 8am-4pm    Remember:  Do not eat OR drink after midnight the night before your surgery    Take these medicines the morning of surgery with A SIP OF WATER:  NONE  As of today, STOP taking any Aspirin (unless otherwise instructed by your surgeon) Aleve, Naproxen, Ibuprofen, Motrin, Advil, Goody's, BC's, all herbal medications, fish oil, and all vitamins.                     Do not wear jewelry, make up, or nail polish            Do not wear lotions, powders, perfumes or deodorant.            Do not shave 48 hours prior to surgery.              Do not bring valuables to the hospital.            [facility] is not responsible for any belongings or valuables.  Do NOT Smoke (Tobacco/Vaping) or drink Alcohol 24 hours prior to your procedure If you use a CPAP at night, you may bring all equipment for your overnight stay.   Contacts, glasses, dentures or bridgework may not be worn into surgery, please bring cases for these belongings   For patients admitted to the hospital, discharge time will be determined by your treatment team.   Patients discharged the day of surgery will not be allowed to drive home, and someone needs to stay with them for 24 hours.    Special instructions:   [place]- Preparing For Surgery  Before surgery, you can play an important role. Because skin is not sterile, your skin needs to be as free of germs as possible. You can reduce the number of germs on your skin by washing with CHG (chlorahexidine gluconate) Soap before surgery.  CHG is an antiseptic cleaner which kills germs and bonds with the skin to continue killing germs even after  washing.    Oral Hygiene is also important to reduce your risk of infection.  Remember - BRUSH YOUR TEETH THE MORNING OF SURGERY WITH YOUR REGULAR TOOTHPASTE  Please do not use if you have an allergy to CHG or antibacterial soaps. If your skin becomes reddened/irritated stop using the CHG.  Do not shave (including legs and underarms) for at least 48 hours prior to first CHG shower. It is OK to shave your face.  Please follow these instructions carefully.   1. Shower the NIGHT BEFORE SURGERY and the MORNING OF SURGERY  2. If you chose to wash your hair, wash your hair first as usual with your normal shampoo.  3. After you shampoo, rinse your hair and body thoroughly to remove the shampoo.  4. Wash Face and genitals (private parts) with your normal soap.   5.  Shower the NIGHT BEFORE SURGERY and the MORNING OF SURGERY with CHG Soap.   6. Use CHG Soap as you would any other liquid soap. You can apply CHG directly to the skin and wash gently with a scrungie or a clean washcloth.   7. Apply the CHG Soap to your body  ONLY FROM THE NECK DOWN.  Do not use on open wounds or open sores. Avoid contact with your eyes, ears, mouth and genitals (private parts). Wash Face and genitals (private parts)  with your normal soap.   8. Wash thoroughly, paying special attention to the area where your surgery will be performed.  9. Thoroughly rinse your body with warm water from the neck down.  10. DO NOT shower/wash with your normal soap after using and rinsing off the CHG Soap.  11. Pat yourself dry with a CLEAN TOWEL.  12. Wear CLEAN PAJAMAS to bed the night before surgery  13. Place CLEAN SHEETS on your bed the night before your surgery  14. DO NOT SLEEP WITH PETS.   Day of Surgery: Wear Clean/Comfortable clothing the morning of surgery Do not apply any deodorants/lotions.   Remember to brush your teeth WITH YOUR REGULAR TOOTHPASTE.   Please read over the following fact sheets that you were  given.

## 2020-06-06 ENCOUNTER — Encounter (HOSPITAL_COMMUNITY): Payer: Self-pay

## 2020-06-06 ENCOUNTER — Other Ambulatory Visit: Payer: Self-pay

## 2020-06-06 ENCOUNTER — Other Ambulatory Visit (HOSPITAL_COMMUNITY)
Admission: RE | Admit: 2020-06-06 | Discharge: 2020-06-06 | Disposition: A | Discharge status: Home / Self Care | Payer: 59 | Source: Ambulatory Visit | Attending: Obstetrics & Gynecology | Admitting: Obstetrics & Gynecology

## 2020-06-06 ENCOUNTER — Encounter (HOSPITAL_COMMUNITY)
Admission: RE | Admit: 2020-06-06 | Discharge: 2020-06-06 | Disposition: A | Discharge status: Home / Self Care | Payer: 59 | Source: Ambulatory Visit | Attending: Obstetrics & Gynecology | Admitting: Obstetrics & Gynecology

## 2020-06-06 DIAGNOSIS — Z01812 Encounter for preprocedural laboratory examination: Secondary | ICD-10-CM | POA: Insufficient documentation

## 2020-06-06 DIAGNOSIS — Z20822 Contact with and (suspected) exposure to covid-19: Secondary | ICD-10-CM | POA: Insufficient documentation

## 2020-06-06 LAB — CBC
HCT: 41.8 % (ref 36.0–46.0)
Hemoglobin: 13.8 g/dL (ref 12.0–15.0)
MCH: 31.8 pg (ref 26.0–34.0)
MCHC: 33 g/dL (ref 30.0–36.0)
MCV: 96.3 fL (ref 80.0–100.0)
Platelets: 250 10*3/uL (ref 150–400)
RBC: 4.34 MIL/uL (ref 3.87–5.11)
RDW: 11.9 % (ref 11.5–15.5)
WBC: 5.5 10*3/uL (ref 4.0–10.5)
nRBC: 0 % (ref 0.0–0.2)

## 2020-06-06 LAB — COMPREHENSIVE METABOLIC PANEL
ALT: 13 U/L (ref 0–44)
AST: 14 U/L — ABNORMAL LOW (ref 15–41)
Albumin: 4.1 g/dL (ref 3.5–5.0)
Alkaline Phosphatase: 40 U/L (ref 38–126)
Anion gap: 9 (ref 5–15)
BUN: 13 mg/dL (ref 6–20)
CO2: 24 mmol/L (ref 22–32)
Calcium: 9.3 mg/dL (ref 8.9–10.3)
Chloride: 103 mmol/L (ref 98–111)
Creatinine, Ser: 0.87 mg/dL (ref 0.44–1.00)
GFR, Estimated: >60 mL/min (ref >60)
Glucose, Bld: 97 mg/dL (ref 70–99)
Potassium: 3.8 mmol/L (ref 3.5–5.1)
Sodium: 136 mmol/L (ref 135–145)
Total Bilirubin: 1.2 mg/dL (ref 0.3–1.2)
Total Protein: 8 g/dL (ref 6.5–8.1)

## 2020-06-06 LAB — TYPE AND SCREEN
ABO/RH(D): A POS
Antibody Screen: NEGATIVE

## 2020-06-06 NOTE — Progress Notes (Signed)
PCP: Jill Alexanders, MD Cardiologist: Denies  EKG: N/A CXR: 05/07/15 ECHO: Denies Stress Test: denies Cardiac Cath: denies  Fasting Blood Sugar- na Checks Blood Sugar__na_ times a day  OSA/CPAP:  No  ASA/Blood Thinners:  No  Covid test 06/06/20  Anesthesia Review:  No  Patient denies shortness of breath, fever, cough, and chest pain at PAT appointment.  Patient verbalized understanding of instructions provided today at the PAT appointment.  Patient asked to review instructions at home and day of surgery.

## 2020-06-07 LAB — SARS CORONAVIRUS 2 (TAT 6-24 HRS): SARS Coronavirus 2: NEGATIVE

## 2020-06-07 NOTE — Anesthesia Preprocedure Evaluation (Addendum)
Anesthesia Evaluation  Patient identified by MRN, date of birth, ID band Patient awake    Reviewed: Allergy & Precautions, NPO status , Patient's Chart, lab work & pertinent test results  History of Anesthesia Complications Negative for: history of anesthetic complications  Airway Mallampati: II  TM Distance: >3 FB Neck ROM: Full    Dental  (+) Dental Advisory Given   Pulmonary neg pulmonary ROS,  06/06/2020 SARS coronavirus NEG   breath sounds clear to auscultation       Cardiovascular negative cardio ROS   Rhythm:Regular Rate:Normal     Neuro/Psych negative neurological ROS     GI/Hepatic negative GI ROS, Neg liver ROS,   Endo/Other  negative endocrine ROS  Renal/GU negative Renal ROS     Musculoskeletal negative musculoskeletal ROS (+)   Abdominal   Peds  Hematology negative hematology ROS (+)   Anesthesia Other Findings   Reproductive/Obstetrics                            Anesthesia Physical Anesthesia Plan  ASA: I  Anesthesia Plan: General   Post-op Pain Management: GA combined w/ Regional for post-op pain   Induction: Intravenous  PONV Risk Score and Plan: 3 and Ondansetron, Dexamethasone and Scopolamine patch - Pre-op  Airway Management Planned: Oral ETT  Additional Equipment: None  Intra-op Plan:   Post-operative Plan: Extubation in OR  Informed Consent: I have reviewed the patients History and Physical, chart, labs and discussed the procedure including the risks, benefits and alternatives for the proposed anesthesia with the patient or authorized representative who has indicated his/her understanding and acceptance.     Dental advisory given  Plan Discussed with: CRNA and Surgeon  Anesthesia Plan Comments: (Plan routine monitors, GETA with TAP blocks for post op analgesia)       Anesthesia Quick Evaluation

## 2020-06-07 NOTE — H&P (Signed)
Alexandra Sutton is an 45 y.o. female G2P2, with large symptomatic uterine fibroid, causing pressure/ pain/dyspareunia and desires myomectomy. She wants to avoid hysterectomy though not planning childbearing in future.  Normal Paps. No dysplasia in past. Normal regular menses but more painful now. 2 kids, SVDs. BC- vasectomy    FamHx of breast cancer- sister.   Patient's last menstrual period was 05/26/2020.    Past Medical History:  Diagnosis Date  . Back pain 11/2011   low and mid-back s/p pregnancy; improved with PT  . SI (sacroiliac) joint dysfunction     No past surgical history on file.  Family History  Problem Relation Age of Onset  . Thyroid disease Mother   . Cancer Mother        kidney and lung  . Fibromyalgia Father   . Breast cancer Sister 51  . Depression Sister   . Thyroid disease Maternal Uncle   . Thyroid disease Maternal Grandmother   . Diabetes Neg Hx   . Heart disease Neg Hx     Social History:  reports that she has never smoked. She has never used smokeless tobacco. She reports current alcohol use. She reports that she does not use drugs.  Allergies: No Known Allergies  No medications prior to admission.    Review of Systems  Neurological: Negative for tremors.    Last menstrual period 05/26/2020. Physical Exam  BP 117/61   Pulse 90   Temp 98.4 F (36.9 C) (Oral)   Resp 16   Ht 5\' 5"  (1.651 m)   Wt 58.2 kg   LMP 05/26/2020   SpO2 100%   BMI 21.35 kg/m   Physical exam:  A&O x 3, no acute distress. Pleasant HEENT neg, no thyromegaly Lungs CTA bilat CV RRR, S1S2 normal Abdo soft, non tender, non acute 16-18wks bulky uterus filling up entire pelvis and with difficult mobility Extr no edema/ tenderness Pelvic large uterus, not well mobile due to filling up the pelvis   CBC CBC Latest Ref Rng & Units 06/06/2020   WBC 4.0 - 10.5 K/uL 5.5   Hemoglobin 12.0 - 15.0 g/dL 13.8   Hematocrit 36.0 - 46.0 % 41.8   Platelets 150 - 400 K/uL 250      Assessment/Plan: 45 yo with large fibroid uterus, bulk symptoms. Desires myomectomy.  Dont recommend laparoscopic myomectomy due to risk of morcellation risk and myoma size 10 cm x 10 cm and not much mobility of uterus.  Planning Abdodminal myomectomy. Pt wants uterus sparing surgery but understands small risk of supracervical hysterectomy if after myomectomy no good uterine wall to stitch back the cavity of myoma, gives permission Also d/w pt cell saver and TAP abdominal block. COnsidering one myoma, may no need cell saver but proceed with TAP block request Risks/complications of surgery reviewed incl infection, bleeding, damage to internal organs including bladder, bowels, ureters, blood vessels, other risks from anesthesia, VTE and delayed complications of any surgery, complications in future surgery reviewed.   Elveria Royals MD  Pt seen in holding room, reviewed H/P and plan, no new changes. --V.Latona Krichbaum, MD

## 2020-06-08 ENCOUNTER — Inpatient Hospital Stay (HOSPITAL_COMMUNITY): Payer: 59 | Admitting: Anesthesiology

## 2020-06-08 ENCOUNTER — Encounter (HOSPITAL_COMMUNITY): Payer: Self-pay | Admitting: Obstetrics & Gynecology

## 2020-06-08 ENCOUNTER — Inpatient Hospital Stay (HOSPITAL_COMMUNITY)
Admission: RE | Admit: 2020-06-08 | Discharge: 2020-06-09 | DRG: 743 | Disposition: A | Discharge status: Home / Self Care | Payer: 59 | Attending: Obstetrics & Gynecology | Admitting: Obstetrics & Gynecology

## 2020-06-08 ENCOUNTER — Other Ambulatory Visit: Payer: Self-pay

## 2020-06-08 ENCOUNTER — Encounter (HOSPITAL_COMMUNITY)
Admission: RE | Disposition: A | Discharge status: Home / Self Care | Payer: Self-pay | Source: Home / Self Care | Attending: Obstetrics & Gynecology

## 2020-06-08 DIAGNOSIS — D259 Leiomyoma of uterus, unspecified: Principal | ICD-10-CM | POA: Diagnosis present

## 2020-06-08 DIAGNOSIS — N946 Dysmenorrhea, unspecified: Secondary | ICD-10-CM | POA: Diagnosis present

## 2020-06-08 DIAGNOSIS — Z20822 Contact with and (suspected) exposure to covid-19: Secondary | ICD-10-CM | POA: Diagnosis present

## 2020-06-08 DIAGNOSIS — N941 Unspecified dyspareunia: Secondary | ICD-10-CM | POA: Diagnosis present

## 2020-06-08 DIAGNOSIS — Z9889 Other specified postprocedural states: Secondary | ICD-10-CM

## 2020-06-08 HISTORY — DX: Other specified postprocedural states: Z98.890

## 2020-06-08 HISTORY — PX: MYOMECTOMY: SHX85

## 2020-06-08 HISTORY — DX: Leiomyoma of uterus, unspecified: D25.9

## 2020-06-08 LAB — POCT PREGNANCY, URINE: Preg Test, Ur: NEGATIVE

## 2020-06-08 LAB — ABO/RH: ABO/RH(D): A POS

## 2020-06-08 SURGERY — MYOMECTOMY, ABDOMINAL APPROACH
Anesthesia: Regional | Site: Abdomen

## 2020-06-08 MED ORDER — ROCURONIUM BROMIDE 100 MG/10ML IV SOLN
INTRAVENOUS | Status: DC | PRN
  Administered 2020-06-08: 60 mg via INTRAVENOUS
  Administered 2020-06-08: 20 mg via INTRAVENOUS

## 2020-06-08 MED ORDER — MIDAZOLAM HCL 5 MG/5ML IJ SOLN
INTRAMUSCULAR | Status: DC | PRN
  Administered 2020-06-08: 2 mg via INTRAVENOUS

## 2020-06-08 MED ORDER — MIDAZOLAM HCL 2 MG/2ML IJ SOLN: 0.5000 mg | Freq: Once | INTRAMUSCULAR | Status: DC | PRN

## 2020-06-08 MED ORDER — VASOPRESSIN 20 UNIT/ML IV SOLN
INTRAVENOUS | Status: AC
  Filled 2020-06-08: qty 2

## 2020-06-08 MED ORDER — LACTATED RINGERS IV SOLN: INTRAVENOUS | Status: DC

## 2020-06-08 MED ORDER — HYDROMORPHONE HCL 1 MG/ML IJ SOLN
INTRAMUSCULAR | Status: AC
  Filled 2020-06-08: qty 1

## 2020-06-08 MED ORDER — PROPOFOL 1000 MG/100ML IV EMUL
INTRAVENOUS | Status: AC
  Filled 2020-06-08: qty 200

## 2020-06-08 MED ORDER — HYDROMORPHONE HCL 1 MG/ML IJ SOLN
0.2500 mg | INTRAMUSCULAR | Status: DC | PRN
  Administered 2020-06-08: 0.25 mg via INTRAVENOUS

## 2020-06-08 MED ORDER — PROPOFOL 10 MG/ML IV BOLUS
INTRAVENOUS | Status: AC
  Filled 2020-06-08: qty 40

## 2020-06-08 MED ORDER — OXYCODONE HCL 5 MG PO TABS
5.0000 mg | ORAL_TABLET | ORAL | Status: DC | PRN
  Administered 2020-06-08: 5 mg via ORAL
  Filled 2020-06-08: qty 1

## 2020-06-08 MED ORDER — KETOROLAC TROMETHAMINE 30 MG/ML IJ SOLN
INTRAMUSCULAR | Status: AC
  Filled 2020-06-08: qty 1

## 2020-06-08 MED ORDER — BUPIVACAINE HCL (PF) 0.25 % IJ SOLN: INTRAMUSCULAR | Status: DC | PRN

## 2020-06-08 MED ORDER — ONDANSETRON HCL 4 MG/2ML IJ SOLN
INTRAMUSCULAR | Status: DC | PRN
  Administered 2020-06-08: 4 mg via INTRAVENOUS

## 2020-06-08 MED ORDER — OXYCODONE HCL 5 MG/5ML PO SOLN: 5.0000 mg | Freq: Once | ORAL | Status: DC | PRN

## 2020-06-08 MED ORDER — LIDOCAINE 2% (20 MG/ML) 5 ML SYRINGE
INTRAMUSCULAR | Status: AC
  Filled 2020-06-08: qty 5

## 2020-06-08 MED ORDER — DEXAMETHASONE SODIUM PHOSPHATE 10 MG/ML IJ SOLN
INTRAMUSCULAR | Status: DC | PRN
  Administered 2020-06-08: 5 mg via INTRAVENOUS

## 2020-06-08 MED ORDER — FENTANYL CITRATE (PF) 250 MCG/5ML IJ SOLN
INTRAMUSCULAR | Status: DC | PRN
  Administered 2020-06-08 (×4): 50 ug via INTRAVENOUS

## 2020-06-08 MED ORDER — DEXAMETHASONE SODIUM PHOSPHATE 10 MG/ML IJ SOLN
INTRAMUSCULAR | Status: AC
  Filled 2020-06-08: qty 1

## 2020-06-08 MED ORDER — ACETAMINOPHEN 325 MG PO TABS: 650.0000 mg | ORAL_TABLET | ORAL | Status: DC | PRN

## 2020-06-08 MED ORDER — ONDANSETRON HCL 4 MG/2ML IJ SOLN: 4.0000 mg | Freq: Four times a day (QID) | INTRAMUSCULAR | Status: DC | PRN

## 2020-06-08 MED ORDER — CHLORHEXIDINE GLUCONATE 0.12 % MT SOLN
15.0000 mL | Freq: Once | OROMUCOSAL | Status: AC
  Administered 2020-06-08: 15 mL via OROMUCOSAL
  Filled 2020-06-08: qty 15

## 2020-06-08 MED ORDER — ONDANSETRON HCL 4 MG PO TABS: 4.0000 mg | ORAL_TABLET | Freq: Four times a day (QID) | ORAL | Status: DC | PRN

## 2020-06-08 MED ORDER — PROPOFOL 10 MG/ML IV BOLUS
INTRAVENOUS | Status: DC | PRN
  Administered 2020-06-08: 120 mg via INTRAVENOUS

## 2020-06-08 MED ORDER — DEXMEDETOMIDINE (PRECEDEX) IN NS 20 MCG/5ML (4 MCG/ML) IV SYRINGE
PREFILLED_SYRINGE | INTRAVENOUS | Status: AC
  Filled 2020-06-08: qty 5

## 2020-06-08 MED ORDER — PROMETHAZINE HCL 25 MG/ML IJ SOLN: 6.2500 mg | INTRAMUSCULAR | Status: DC | PRN

## 2020-06-08 MED ORDER — CEFAZOLIN SODIUM-DEXTROSE 2-4 GM/100ML-% IV SOLN
2.0000 g | INTRAVENOUS | Status: AC
  Administered 2020-06-08: 2 g via INTRAVENOUS
  Filled 2020-06-08: qty 100

## 2020-06-08 MED ORDER — KETOROLAC TROMETHAMINE 30 MG/ML IJ SOLN
30.0000 mg | Freq: Four times a day (QID) | INTRAMUSCULAR | Status: AC
Start: 2020-06-08 — End: 2020-06-09
  Administered 2020-06-08 – 2020-06-09 (×4): 30 mg via INTRAVENOUS
  Filled 2020-06-08 (×4): qty 1

## 2020-06-08 MED ORDER — OXYCODONE HCL 5 MG PO TABS: 5.0000 mg | ORAL_TABLET | Freq: Once | ORAL | Status: DC | PRN

## 2020-06-08 MED ORDER — PHENYLEPHRINE 40 MCG/ML (10ML) SYRINGE FOR IV PUSH (FOR BLOOD PRESSURE SUPPORT)
PREFILLED_SYRINGE | INTRAVENOUS | Status: AC
  Filled 2020-06-08: qty 10

## 2020-06-08 MED ORDER — 0.9 % SODIUM CHLORIDE (POUR BTL) OPTIME
TOPICAL | Status: DC | PRN
  Administered 2020-06-08: 1000 mL

## 2020-06-08 MED ORDER — PROPOFOL 1000 MG/100ML IV EMUL
INTRAVENOUS | Status: AC
  Filled 2020-06-08: qty 100

## 2020-06-08 MED ORDER — POVIDONE-IODINE 10 % EX SWAB
2.0000 "application " | Freq: Once | CUTANEOUS | Status: AC
  Administered 2020-06-08: 2 via TOPICAL

## 2020-06-08 MED ORDER — MIDAZOLAM HCL 2 MG/2ML IJ SOLN
INTRAMUSCULAR | Status: AC
  Filled 2020-06-08: qty 2

## 2020-06-08 MED ORDER — ACETAMINOPHEN 500 MG PO TABS
1000.0000 mg | ORAL_TABLET | Freq: Once | ORAL | Status: AC
  Administered 2020-06-08: 1000 mg via ORAL
  Filled 2020-06-08: qty 2

## 2020-06-08 MED ORDER — ONDANSETRON HCL 4 MG/2ML IJ SOLN
INTRAMUSCULAR | Status: AC
  Filled 2020-06-08: qty 2

## 2020-06-08 MED ORDER — SUGAMMADEX SODIUM 200 MG/2ML IV SOLN
INTRAVENOUS | Status: DC | PRN
  Administered 2020-06-08: 150 mg via INTRAVENOUS

## 2020-06-08 MED ORDER — SCOPOLAMINE 1 MG/3DAYS TD PT72
1.0000 | MEDICATED_PATCH | TRANSDERMAL | Status: DC
  Administered 2020-06-08: 1.5 mg via TRANSDERMAL
  Filled 2020-06-08: qty 1

## 2020-06-08 MED ORDER — FENTANYL CITRATE (PF) 250 MCG/5ML IJ SOLN
INTRAMUSCULAR | Status: AC
  Filled 2020-06-08: qty 5

## 2020-06-08 MED ORDER — PROPOFOL 500 MG/50ML IV EMUL
INTRAVENOUS | Status: DC | PRN
  Administered 2020-06-08: 50 ug/kg/min via INTRAVENOUS

## 2020-06-08 MED ORDER — BUPIVACAINE HCL (PF) 0.25 % IJ SOLN
INTRAMUSCULAR | Status: AC
  Filled 2020-06-08: qty 30

## 2020-06-08 MED ORDER — PHENYLEPHRINE 40 MCG/ML (10ML) SYRINGE FOR IV PUSH (FOR BLOOD PRESSURE SUPPORT)
PREFILLED_SYRINGE | INTRAVENOUS | Status: DC | PRN
  Administered 2020-06-08 (×2): 80 ug via INTRAVENOUS

## 2020-06-08 MED ORDER — VASOPRESSIN 20 UNIT/ML IV SOLN
INTRAVENOUS | Status: DC | PRN
  Administered 2020-06-08: 40 mL via INTRAMUSCULAR

## 2020-06-08 MED ORDER — MEPERIDINE HCL 25 MG/ML IJ SOLN: 6.2500 mg | INTRAMUSCULAR | Status: DC | PRN

## 2020-06-08 MED ORDER — MENTHOL 3 MG MT LOZG: 1.0000 | LOZENGE | OROMUCOSAL | Status: DC | PRN

## 2020-06-08 MED ORDER — BUPIVACAINE-EPINEPHRINE (PF) 0.5% -1:200000 IJ SOLN
INTRAMUSCULAR | Status: DC | PRN
  Administered 2020-06-08 (×2): 15 mL

## 2020-06-08 MED ORDER — ORAL CARE MOUTH RINSE: 15.0000 mL | Freq: Once | OROMUCOSAL | Status: AC

## 2020-06-08 MED ORDER — ROCURONIUM BROMIDE 10 MG/ML (PF) SYRINGE
PREFILLED_SYRINGE | INTRAVENOUS | Status: AC
  Filled 2020-06-08: qty 10

## 2020-06-08 MED ORDER — IBUPROFEN 800 MG PO TABS: 800.0000 mg | ORAL_TABLET | Freq: Four times a day (QID) | ORAL | Status: DC

## 2020-06-08 MED ORDER — KETOROLAC TROMETHAMINE 30 MG/ML IJ SOLN
30.0000 mg | Freq: Once | INTRAMUSCULAR | Status: AC
  Administered 2020-06-08: 30 mg via INTRAVENOUS

## 2020-06-08 SURGICAL SUPPLY — 40 items
APL SKNCLS STERI-STRIP NONHPOA (GAUZE/BANDAGES/DRESSINGS) ×2
BARRIER ADHS 3X4 INTERCEED (GAUZE/BANDAGES/DRESSINGS) ×2 IMPLANT
BENZOIN TINCTURE PRP APPL 2/3 (GAUZE/BANDAGES/DRESSINGS) ×3 IMPLANT
BRR ADH 4X3 ABS CNTRL BYND (GAUZE/BANDAGES/DRESSINGS) ×2
CANISTER SUCT 3000ML PPV (MISCELLANEOUS) ×1 IMPLANT
CLSR STERI-STRIP ANTIMIC 1/2X4 (GAUZE/BANDAGES/DRESSINGS) ×1 IMPLANT
CONT SPEC PATH 64OZ SNAP LID (MISCELLANEOUS) ×1 IMPLANT
DECANTER SPIKE VIAL GLASS SM (MISCELLANEOUS) ×3 IMPLANT
DRAPE CESAREAN BIRTH W POUCH (DRAPES) ×3 IMPLANT
DRSG OPSITE POSTOP 4X10 (GAUZE/BANDAGES/DRESSINGS) ×3 IMPLANT
DURAPREP 26ML APPLICATOR (WOUND CARE) ×5 IMPLANT
ELECT NDL TIP 2.8 STRL (NEEDLE) ×1 IMPLANT
ELECT NEEDLE TIP 2.8 STRL (NEEDLE) IMPLANT
GAUZE 4X4 16PLY RFD (DISPOSABLE) ×2 IMPLANT
GLOVE SURG ENC MOIS LTX SZ6.5 (GLOVE) ×3 IMPLANT
GLOVE SURG UNDER POLY LF SZ7 (GLOVE) ×6 IMPLANT
GOWN STRL REUS W/ TWL LRG LVL3 (GOWN DISPOSABLE) ×7 IMPLANT
GOWN STRL REUS W/TWL LRG LVL3 (GOWN DISPOSABLE) ×12
KIT TURNOVER KIT B (KITS) ×3 IMPLANT
NEEDLE HYPO 22GX1.5 SAFETY (NEEDLE) ×6 IMPLANT
NS IRRIG 1000ML POUR BTL (IV SOLUTION) ×3 IMPLANT
PACK ABDOMINAL GYN (CUSTOM PROCEDURE TRAY) ×3 IMPLANT
PAD ARMBOARD 7.5X6 YLW CONV (MISCELLANEOUS) ×3 IMPLANT
PAD OB MATERNITY 4.3X12.25 (PERSONAL CARE ITEMS) ×3 IMPLANT
STRIP CLOSURE SKIN 1/2X4 (GAUZE/BANDAGES/DRESSINGS) ×2 IMPLANT
SUT MON AB 2-0 CT1 27 (SUTURE) ×3 IMPLANT
SUT MON AB 2-0 SH 27 (SUTURE)
SUT MON AB 2-0 SH27 (SUTURE) ×1 IMPLANT
SUT MON AB 3-0 SH 27 (SUTURE)
SUT MON AB 3-0 SH27 (SUTURE) ×1 IMPLANT
SUT VIC AB 0 CT1 18XCR BRD8 (SUTURE) ×3 IMPLANT
SUT VIC AB 0 CT1 27 (SUTURE) ×6
SUT VIC AB 0 CT1 27XBRD ANBCTR (SUTURE) ×4 IMPLANT
SUT VIC AB 0 CT1 8-18 (SUTURE) ×6
SUT VIC AB 2-0 SH 27 (SUTURE) ×3
SUT VIC AB 2-0 SH 27XBRD (SUTURE) ×1 IMPLANT
SUT VIC AB 4-0 KS 27 (SUTURE) ×3 IMPLANT
SYR CONTROL 10ML LL (SYRINGE) ×6 IMPLANT
TOWEL GREEN STERILE FF (TOWEL DISPOSABLE) ×6 IMPLANT
TRAY FOLEY W/BAG SLVR 14FR (SET/KITS/TRAYS/PACK) ×3 IMPLANT

## 2020-06-08 NOTE — Op Note (Signed)
PRE-OPERATIVE DIAGNOSIS:  Uterine Fibroid   POST-OPERATIVE DIAGNOSIS:  Uterine Fibroid  PROCEDURE:  ABDOMINAL MYOMECTOMY  SURGEON: Elveria Royals, MD  ASSISTANT: None Gaylord Shih was surgical tech who helped with surgery)  ANESTHESIA:  General endotracheal and TAPP block on abdominal wall   EBL: 50 cc  IVF: LR 1200 cc   Urine output: urine in foley 30 cc   BLOOD ADMINISTERED:None  LOCAL MEDICATIONS USED:  None since patient had TAPP block   SPECIMEN: Fibroid to pathology  COUNTS:  YES  PATIENT DISPOSITION:  PACU - hemodynamically stable. Then admit for post-op care.   Delay start of Pharmacological VTE agent (>24hrs) due to surgical blood loss or risk of bleeding: yes  PROCEDURE:   Indication:  Symptomatic uterine fibroid with abdominal mass, pain and dysmenorrhea. Patient desired to conserve uterus and wanted myomectomy.  Risks and complications of surgery including infection, bleeding, damage to internal organs and other including but not limited to surgery related problems including pneumonia, VTE reviewed. Informed written consent was obtained.   Patient was brought to the operating room with IV running. She received 2 gm Ancef. Underwent general anesthesia without difficulty and was given dorsal supine position, prepped and draped in sterile fashion. Foley catheter was placed. Exam under anesthesia noted uterus at the umbilicus but mobile. Pfannenstiel incision was made with scalpel and carried down to the underlying fascia with Bovie with excellent hemostasis.  Fascia incised and extended laterally. Fascia grasped with Kocher's and underlying rectus muscles were dissected down. Rectus muscles were separated in midline. Posterior rectus sheath and posterior peritoneum was grasped with mosquitoes and peritoneal entry made.  Uterus was normal sitting on top of the large fibroid that was mostly anterior but felt like it was protruding posteriorly as well. Both tubes and  ovaries were normal. Cervix was difficult to palpate but felt like the lower edge of fibroid was above the cervix. Laterally the fibroid favored slightly more to the left distorting left round ligament. After assessing several times the location of fibroid, decision was made to attempt myomectomy and proceed with hysterectomy if uterine vessel bleeding encountered.  Bladder flap created anteriorly and bladder pushed down. 30 cc of diluted 20 units in 50 cc of Vasopressin used to inject the fibroid all around on anterior wall. Vertical incision made with Bovie on lower segment of uterus and carried down till fibroid was palpated. Edges of uterine wall grasped with Allis and fibroid with towel clamp and elevated the fibroid and enucleated by blunt and sharp dissection, controlling bleeding well. Some bleeders clamped with Hemostat. Fibroid dissection completed while carefully watching anatomy especially left broad ligament and left uterine vessels and adnexa. No complications encountered. Once fibroid was handed off for pathology, myoma dead space was approximated with multiple figure of eight stitches using 0-Vicryl pop-offs and a second layer closed to complete excellent hemostatic closure. Serosa approximated with 2-0 Monocryl in Baseball stitch fashion. Hemostasis and complete closure noted. Uterus was placed back in pelvis and Interceed placed on incision line after suction irrigation of pelvis. Peritoneal edges grasped and peritoneum closed with 2-0 Vicryl. Fascia sutured with 0 Vicryl from two ends and met in midline. Skin approximated with 4-0 Vicryl in subcuticular fashion. Steristrips and honeycomb sterile dressing placed. All counts were correct x2. No complications. I was the surgeon for entire case.   --Azucena Fallen, MD

## 2020-06-08 NOTE — Anesthesia Postprocedure Evaluation (Signed)
Anesthesia Post Note  Patient: Alexandra Sutton  Procedure(s) Performed: ABDOMINAL MYOMECTOMY (N/A Abdomen)     Patient location during evaluation: PACU Anesthesia Type: Regional Level of consciousness: patient cooperative, oriented and sedated Pain management: pain level controlled (pain improving) Vital Signs Assessment: post-procedure vital signs reviewed and stable Respiratory status: spontaneous breathing, nonlabored ventilation and respiratory function stable Cardiovascular status: blood pressure returned to baseline and stable Postop Assessment: no apparent nausea or vomiting Anesthetic complications: no   No complications documented.  Last Vitals:  Vitals:   06/08/20 1125 06/08/20 1215  BP: 101/61 (!) 104/57  Pulse: 68 65  Resp: 16 17  Temp: 36.8 C   SpO2: 97% 98%    Last Pain:  Vitals:   06/08/20 1125  TempSrc: Axillary  PainSc: 8                  [name],E. Jayquan Bradsher

## 2020-06-08 NOTE — Anesthesia Procedure Notes (Signed)
Anesthesia Regional Block: TAP block   Pre-Anesthetic Checklist: ,, timeout performed, Correct Patient, Correct Site, Correct Laterality, Correct Procedure, Correct Position, site marked, Risks and benefits discussed,  Surgical consent,  Pre-op evaluation,  At surgeon's request and post-op pain management  Laterality: Right  Prep: chloraprep       Needles:  Injection technique: Single-shot  Needle Type: Echogenic Needle     Needle Length: 9cm  Needle Gauge: 21     Additional Needles:   Procedures:,,,, ultrasound used (permanent image in chart),,,,  Narrative:  Start time: 06/08/2020 7:14 AM End time: 06/08/2020 7:19 AM Injection made incrementally with aspirations every 5 mL.  Performed by: Personally  Anesthesiologist: Annye Asa, MD  Additional Notes: Pt identified in Holding room.  Monitors applied. Working IV access confirmed. Sterile prep R flank.  #21ga ECHOgenic Arrow block needle into TAP with US guidance.  15cc 0.5% Bupivacaine with 1:200k epi injected incrementally after negative test dose.  Patient asymptomatic, VSS, no heme aspirated, tolerated well.  Jenita Seashore, MD

## 2020-06-08 NOTE — Plan of Care (Signed)

## 2020-06-08 NOTE — Transfer of Care (Signed)
Immediate Anesthesia Transfer of Care Note  Patient: Alexandra Sutton  Procedure(s) Performed: ABDOMINAL MYOMECTOMY (N/A Abdomen)  Patient Location: PACU  Anesthesia Type:General and Regional  Level of Consciousness: drowsy  Airway & Oxygen Therapy: Patient Spontanous Breathing and Patient connected to face mask oxygen  Post-op Assessment: Report given to RN and Post -op Vital signs reviewed and stable  Post vital signs: Reviewed and stable  Last Vitals:  Vitals Value Taken Time  BP 105/62 06/08/20 0930  Temp    Pulse 65 06/08/20 0932  Resp 14 06/08/20 0932  SpO2 100 % 06/08/20 0932  Vitals shown include unvalidated device data.  Last Pain:  Vitals:   06/08/20 0604  TempSrc: Oral  PainSc:          Complications: No complications documented.

## 2020-06-08 NOTE — Anesthesia Procedure Notes (Signed)
Anesthesia Regional Block: TAP block   Pre-Anesthetic Checklist: ,, timeout performed, Correct Patient, Correct Site, Correct Laterality, Correct Procedure, Correct Position, site marked, Risks and benefits discussed,  Surgical consent,  Pre-op evaluation,  At surgeon's request and post-op pain management  Laterality: Left  Prep: chloraprep       Needles:  Injection technique: Single-shot  Needle Type: Echogenic Needle     Needle Length: 9cm  Needle Gauge: 21     Additional Needles:   Procedures:,,,, ultrasound used (permanent image in chart),,,,  Narrative:  Start time: 06/08/2020 7:20 AM End time: 06/08/2020 7:23 AM Injection made incrementally with aspirations every 5 mL.  Performed by: Personally  Anesthesiologist: Annye Asa, MD  Additional Notes: Pt identified in Holding room.  Monitors applied. Working IV access confirmed. Sterile prep L flank.  #21ga ECHOgenic Arrow block needle into TAP with US guidance.  15cc 0.5% Bupivacaine with 1:200k epi injected incrementally after negative test dose.  Patient asymptomatic, VSS, no heme aspirated, tolerated well.  Jenita Seashore, MD

## 2020-06-08 NOTE — Anesthesia Procedure Notes (Signed)
Procedure Name: Intubation Date/Time: 06/08/2020 7:41 AM Performed by: Hendricks Limes, CRNA Pre-anesthesia Checklist: Patient identified, Emergency Drugs available, Suction available and Patient being monitored Patient Re-evaluated:Patient Re-evaluated prior to induction Oxygen Delivery Method: Circle system utilized Preoxygenation: Pre-oxygenation with 100% oxygen Induction Type: IV induction Ventilation: Mask ventilation without difficulty Laryngoscope Size: Miller and 2 Grade View: Grade I Tube type: Oral Tube size: 7.0 mm Number of attempts: 1 Airway Equipment and Method: Stylet Placement Confirmation: ETT inserted through vocal cords under direct vision,  positive ETCO2 and breath sounds checked- equal and bilateral Secured at: 20 cm Tube secured with: Tape Dental Injury: Teeth and Oropharynx as per pre-operative assessment

## 2020-06-09 ENCOUNTER — Encounter (HOSPITAL_COMMUNITY): Payer: Self-pay | Admitting: Obstetrics & Gynecology

## 2020-06-09 LAB — SURGICAL PATHOLOGY

## 2020-06-09 MED ORDER — IBUPROFEN 800 MG PO TABS: 800.0000 mg | ORAL_TABLET | Freq: Four times a day (QID) | ORAL | 0 refills | Status: AC

## 2020-06-09 MED ORDER — OXYCODONE HCL 5 MG PO TABS: 5.0000 mg | ORAL_TABLET | Freq: Four times a day (QID) | ORAL | 0 refills | Status: AC | PRN

## 2020-06-09 MED ORDER — ACETAMINOPHEN 325 MG PO TABS: 650.0000 mg | ORAL_TABLET | ORAL | Status: AC | PRN

## 2020-06-09 NOTE — Discharge Summary (Signed)
Physician Discharge Summary  Patient ID: Alexandra Sutton MRN: [id number] DOB/AGE: [birth year] 45 y.o.  Admit date: 06/08/2020 Discharge date: 06/09/2020  Admission Diagnoses: symptomatic fibroid uterus  Discharge Diagnoses: Abdominal myomectomy  Discharged Condition: good  Hospital Course: uncomplicated. Tolerated diet, ambulated, voided and pain controlled.   Discharge Exam: Blood pressure (!) 101/40, pulse 73, temperature 98.3 F (36.8 C), temperature source Oral, resp. rate 18, height 5\' 5"  (1.651 m), weight 58.2 kg, last menstrual period 05/26/2020, SpO2 97 %. See progress note  Disposition: Discharge disposition: 01-Home or Self Care       Discharge Instructions    Call MD for:  difficulty breathing, headache or visual disturbances   Complete by: As directed    Call MD for:  hives   Complete by: As directed    Call MD for:  persistant nausea and vomiting   Complete by: As directed    Call MD for:  redness, tenderness, or signs of infection (pain, swelling, redness, odor or green/yellow discharge around incision site)   Complete by: As directed    Call MD for:  severe uncontrolled pain   Complete by: As directed    Call MD for:  temperature >100.4   Complete by: As directed    Diet - low sodium heart healthy   Complete by: As directed    Discharge wound care:   Complete by: As directed    Remove honeycomb dressing on 06/13/20   Driving Restrictions   Complete by: As directed    2-3 weeks as needed   Increase activity slowly   Complete by: As directed    Lifting restrictions   Complete by: As directed    20 lbs for 6 weeks   Sexual Activity Restrictions   Complete by: As directed    6 weeks     Allergies as of 06/09/2020   No Known Allergies     Medication List    TAKE these medications   acetaminophen 325 MG tablet Commonly known as: TYLENOL Take 2 tablets (650 mg total) by mouth every 4 (four) hours as needed for mild pain (temperature > 101.5.).    Flaxseed Oil 1000 MG Caps Take 2,000 mg by mouth daily.   ibuprofen 800 MG tablet Commonly known as: ADVIL Take 1 tablet (800 mg total) by mouth every 6 (six) hours.   IODINE (KELP) PO Take 225 mcg by mouth 2 (two) times a week.   OVER THE COUNTER MEDICATION Take 2 capsules by mouth daily. Wild Yam Chest Tree   OVER THE COUNTER MEDICATION Take 2 capsules by mouth daily. Nature's Sunshine [place]   OVER THE COUNTER MEDICATION Take 2 capsules by mouth daily. Catechola Calm   oxyCODONE 5 MG immediate release tablet Commonly known as: Oxy IR/ROXICODONE Take 1 tablet (5 mg total) by mouth every 6 (six) hours as needed for up to 5 days for moderate pain.   VALERIAN ROOT PO Take 1 capsule by mouth at bedtime.   Vitamin D 50 MCG (2000 UT) tablet Take 4,000 Units by mouth daily.            Discharge Care Instructions  (From admission, onward)         Start     Ordered   06/09/20 0000  Discharge wound care:       Comments: Remove honeycomb dressing on 06/13/20   06/09/20 1302           Signed: Elveria Royals 06/09/2020, 1:03 PM

## 2020-06-09 NOTE — Progress Notes (Signed)
Subjective: Foley still in, but ambulated,pain controlled. Eating regular diet, flatus + Objective: Vital signs in last 24 hours: Temp:  [98 F (36.7 C)-99 F (37.2 C)] 98.3 F (36.8 C) (03/04 1142) Pulse Rate:  [66-74] 73 (03/04 1142) Resp:  [17-18] 18 (03/04 1142) BP: (87-105)/(40-54) 101/40 (03/04 1142) SpO2:  [97 %-100 %] 97 % (03/04 1142) Weight change:  Last BM Date: 06/08/20  Physical exam:  A&O x 3, no acute distress. Pleasant HEENT neg Lungs CTA bilat CV RRR, S1S2 normal Abdo soft, non tender, non acute. NABS, incision dressing dry  Extr no edema/ tenderness Pelvic def    Intake/Output from previous day: 03/03 0701 - 03/04 0700 In: 2954.2 [P.O.:960; I.V.:1894.2; IV Piggyback:100] Out: 4030 [Urine:3980; Blood:50] Intake/Output this shift: Total I/O In: 360 [P.O.:360] Out: 900 [Urine:900]   Assessment/Plan: POD #1, Abdo myomectomy. Doing well. Okay for discharge later if ambulating, voiding and tolerating PO pain meds.  Surgical findings and post op care dw pt   LOS: 1 day   Alexandra Sutton 06/09/2020, 12:48 PM

## 2020-06-09 NOTE — Final Progress Note (Signed)
NT tried to get pt to ambulate this morning . Pt got lightheaded and nauseated and stated she couldn't do it right now. Nursing staff assisted pt back in bed. Rn will report to morning nurse and we will try again later in the a.m.

## 2020-06-09 NOTE — Progress Notes (Signed)
Pt discharge to home teaching complete  Out in wheelchair

## 2023-04-23 ENCOUNTER — Other Ambulatory Visit: Payer: Self-pay

## 2023-04-23 ENCOUNTER — Ambulatory Visit (HOSPITAL_COMMUNITY)
Admission: EM | Admit: 2023-04-23 | Discharge: 2023-04-23 | Disposition: A | Discharge status: Home / Self Care | Payer: BC Managed Care – PPO | Attending: Family Medicine | Admitting: Family Medicine

## 2023-04-23 ENCOUNTER — Encounter (HOSPITAL_COMMUNITY): Payer: Self-pay | Admitting: *Deleted

## 2023-04-23 DIAGNOSIS — R079 Chest pain, unspecified: Secondary | ICD-10-CM | POA: Diagnosis not present

## 2023-04-23 NOTE — ED Triage Notes (Signed)
 Pt reports chest tightness that comes and goes for one week.. Pt has been under a lot of stress this year.

## 2023-04-23 NOTE — ED Provider Notes (Signed)
 [facility]    CSN: [id number] Arrival date & time: 04/23/23  0807      History   Chief Complaint Chief Complaint  Patient presents with   Chest Pain    HPI Alexandra Sutton is a 48 y.o. female.   Patient presents to clinic for left-sided chest pressure that has been intermittent for the past week.  Does not associate this with any activities, nothing makes the pressure better or worse.  Pressure is usually mild, feels like her hand is squeezing her heart, rates it between a 1 and a 4 out of 10.  Sometimes wearing clothing or things on top of the left chest will make the pressure worse.  She has not tried any medications.  Reports she had similar pain when her father passed last year.  Has been has been suffering from seizures, this is not helping.  She has been under a lot of stress, reports the pressure feels like anxiety.  Her and her husband have been going through relationship problems and they are currently in counseling, which can trigger an increase in her anxiety.  She has been taken over-the-counter anxiety supplement, unsure what is in this.  The chest pressure stopped once she stopped taking this on Monday, but then soon resumed.  No family history of cardiovascular disease before the age of 78.  No history of hypertension or hypercholesteremia.  She does not smoke. No cough, wheezing or shortness of breath. No weakness.      The history is provided by the patient and medical records.  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 Authorizing Provider  acetaminophen  (TYLENOL ) 325 MG tablet Take 2 tablets (650 mg total) by mouth every 4 (four) hours as needed for mild pain (temperature > 101.5.). 06/09/20  Yes Terri Fester, MD  ibuprofen  (ADVIL ) 800 MG tablet Take 1 tablet (800 mg total) by mouth every 6 (six) hours. 06/09/20  Yes Mody, Vaishali, MD  Cholecalciferol (VITAMIN D ) 50 MCG (2000 UT) tablet Take 4,000 Units by mouth daily.    [provider]  Flaxseed, Linseed, (FLAXSEED OIL) 1000 MG CAPS Take 2,000 mg by mouth daily.    [provider]  IODINE , KELP, PO Take 225 mcg by mouth 2 (two) times a week.    [provider]  OVER THE COUNTER MEDICATION Take 2 capsules by mouth  daily. [employer]    [provider]  OVER THE COUNTER MEDICATION Take 2 capsules by mouth daily. [facility]    [provider]  OVER THE COUNTER MEDICATION Take 2 capsules by mouth daily. Catechola Calm    [provider]  VALERIAN ROOT PO Take 1 capsule by mouth at bedtime.    [provider]    Family History Family History  Problem Relation Age of Onset   Thyroid disease Mother    Cancer Mother        kidney and lung   Fibromyalgia Father    Breast cancer Sister 68   Depression Sister    Thyroid disease Maternal Uncle    Thyroid disease Maternal Grandmother    Diabetes Neg Hx    Heart disease Neg Hx     Social History Social History   Tobacco Use   Smoking status: Never   Smokeless tobacco: Never  Vaping Use   Vaping status: Never Used  Substance Use Topics   Alcohol use: Yes    Comment: 1 drink /week   Drug  use: No    Comment: none since 2013 (prior marijuana)     Allergies   Patient has no known allergies.   Review of Systems Review of Systems  Per HPI   Physical Exam Triage Vital Signs ED Triage Vitals  Encounter Vitals Group     BP 04/23/23 0834 115/86     Systolic BP Percentile --      Diastolic BP Percentile --      Pulse Rate 04/23/23 0834 86     Resp 04/23/23 0834 20     Temp 04/23/23 0834 98.3 F (36.8 C)     Temp src --      SpO2 04/23/23 0834 99 %     Weight --      Height --      Head Circumference --      Peak Flow --      Pain Score 04/23/23 0835 4     Pain Loc --      Pain Education --      Exclude from Growth Chart --    No data found.  Updated Vital Signs BP 115/86   Pulse 86   Temp 98.3 F (36.8 C)   Resp 20   LMP 04/07/2023   SpO2 99%   Visual Acuity Right Eye Distance:   Left Eye Distance:   Bilateral Distance:    Right Eye Near:   Left Eye Near:    Bilateral Near:     Physical Exam Vitals and nursing note reviewed.  Constitutional:      Appearance: Normal appearance. She is well-developed.  HENT:     Head: Normocephalic and atraumatic.     Right Ear: External ear normal.     Left Ear: External ear normal.     Nose: Nose normal.     Mouth/Throat:     Mouth: Mucous membranes are moist.  Eyes:     Conjunctiva/sclera: Conjunctivae normal.  Cardiovascular:     Rate and Rhythm: Normal rate and regular rhythm.     Heart sounds: Normal heart sounds. No murmur heard. Pulmonary:     Effort: Pulmonary effort is normal. No respiratory distress.     Breath sounds: Normal breath sounds.  Musculoskeletal:        General: Normal range of motion.  Skin:    General: Skin is warm and dry.  Neurological:  General: No focal deficit present.     Mental Status: She is alert and oriented to person, place, and time.  Psychiatric:        Mood and Affect: Mood normal.        Behavior: Behavior normal.      UC Treatments / Results   Labs (all labs ordered are listed, but only abnormal results are displayed) Labs Reviewed - No data to display  EKG   Radiology No results found.  Procedures Procedures (including critical care time)  Medications Ordered in UC Medications - No data to display  Initial Impression / Assessment and Plan / UC Course  I have reviewed the triage vital signs and the nursing notes.  Pertinent labs & imaging results that were available during my care of the patient were reviewed by me and considered in my medical decision making (see chart for details).  Vitals and triage reviewed, patient is hemodynamically stable. Lungs are vesicular, heart with regular rate and rhythm. Chest wall is non-TTP. EKG shows NSR, rate of 80, no STE or STD. HEAR score is 1 for age. Seems chest pressure is related to anxiety.  Trial hydroxyzine.  Natural stress reduction measures discussed.  Strict emergency precautions given if symptoms evolve or worsen.  Plan of care, follow-up care return precautions given, no questions at this time.     Final Clinical Impressions(s) / UC Diagnoses   Final diagnoses:  Nonspecific chest pain     Discharge Instructions      Overall your physical exam was reassuring, your EKG did not show any signs of a heart attack and your vitals were stable.  I suspect that your intermittent left-sided chest pain may be related to stress and anxiety.  You can trial 25 mg of hydroxyzine every 6 hours as needed for any acute anxiety or chest pressure.  This may cause drowsiness and sedation.  Continue to explore other natural methods such as meditation, walking, yoga and therapy.  Do not hesitate to seek care at the nearest emergency department for advanced evaluation if your chest pain becomes more severe in nature, you develop shortness of breath, jaw pain, weakness, or any new concerning symptoms.      ED Prescriptions   None    PDMP not reviewed this encounter.   Harlow Lighter,  Dartanian Knaggs  N, FNP 04/23/23 [id number]

## 2023-04-23 NOTE — Discharge Instructions (Addendum)
 Overall your physical exam was reassuring, your EKG did not show any signs of a heart attack and your vitals were stable.  I suspect that your intermittent left-sided chest pain may be related to stress and anxiety.  You can trial 25 mg of hydroxyzine every 6 hours as needed for any acute anxiety or chest pressure.  This may cause drowsiness and sedation.  Continue to explore other natural methods such as meditation, walking, yoga and therapy.  Do not hesitate to seek care at the nearest emergency department for advanced evaluation if your chest pain becomes more severe in nature, you develop shortness of breath, jaw pain, weakness, or any new concerning symptoms.

## 2023-06-07 LAB — EXTERNAL GENERIC LAB PROCEDURE: COLOGUARD: NEGATIVE

## 2023-06-07 LAB — COLOGUARD: COLOGUARD: NEGATIVE

## 2023-07-08 DIAGNOSIS — M533 Sacrococcygeal disorders, not elsewhere classified: Secondary | ICD-10-CM | POA: Insufficient documentation

## 2023-07-08 DIAGNOSIS — R079 Chest pain, unspecified: Secondary | ICD-10-CM | POA: Insufficient documentation

## 2023-07-08 HISTORY — DX: Chest pain, unspecified: R07.9

## 2023-07-10 ENCOUNTER — Ambulatory Visit: Attending: Cardiology | Admitting: Cardiology

## 2023-07-10 ENCOUNTER — Encounter: Payer: Self-pay | Admitting: Cardiology

## 2023-07-10 VITALS — BP 100/80 | HR 72 | Ht 65.0 in | Wt 131.0 lb

## 2023-07-10 DIAGNOSIS — R079 Chest pain, unspecified: Secondary | ICD-10-CM | POA: Diagnosis not present

## 2023-07-10 NOTE — Progress Notes (Signed)
 Cardiology Office Note:    Date:  07/10/2023   ID:  Alexandra Sutton, DOB 1975/11/29, MRN [id number]  PCP:  Rocco Pauls  Cardiologist:  Garwin Brothers, MD   Referring MD: D'Iorio, Antony Salmon, MD    ASSESSMENT:    1. Nonspecific chest pain    PLAN:    In order of problems listed above:  Primary prevention stressed with the patient.  Importance of compliance with diet medication stressed and patient verbalized standing. Atypical chest pain: I reassured the patient about my findings.  She hardly has any risk factors for coronary artery disease.  She has had her cholesterol checked recently and will send me a copy.  She knows to go to the nearest emergency room for any significant concerns. She occasionally has fluttering sensations in the chest.  I educated her about cardia app and she will self monitor herself and medicate with this if there are any issues. Patient will be seen in follow-up appointment in 6 months or earlier if the patient has any concerns.    Medication Adjustments/Labs and Tests Ordered: Current medicines are reviewed at length with the patient today.  Concerns regarding medicines are outlined above.  Orders Placed This Encounter  Procedures   EKG 12-Lead   No orders of the defined types were placed in this encounter.    History of Present Illness:    Alexandra Sutton is a 48 y.o. female who is being seen today for the evaluation of chest pain at the request of D'Iorio, Antony Salmon, MD. patient is a pleasant 48 year old female.  She has past medical history that is not much significant.  She denies history of hypertension dyslipidemia or diabetes mellitus.  She is a non-smoker.  She has been under significant stress with work and with her husband quitting his job recently.  She mentions to me of chest discomfort like symptoms probably pressure-like pinpricks.  No orthopnea or PND no radiation to the neck or any part of the body.  She does not have these when she  exerts herself.  For this reason she came here for evaluation.  At the time of my evaluation, the patient is alert awake oriented and in no distress.  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   ibuprofen (ADVIL) 800 MG tablet Take 1 tablet (800 mg total) by mouth every 6 (six) hours.   OVER THE COUNTER MEDICATION Take 2 capsules by mouth daily. [employer] Chest [employer]   OVER THE COUNTER MEDICATION Take 2 capsules by mouth daily. Nature's [employer] [place]   OVER THE COUNTER MEDICATION Take 2 capsules by mouth daily. Catechola Calm   UNABLE TO FIND Take 1 capsule by mouth daily. Med Name: PHYTO SOY   VALERIAN ROOT PO Take 1 capsule by mouth at bedtime.     Allergies:   Patient has no known allergies.  Social History   Socioeconomic History   Marital status: Married    Spouse name: Not on file   Number of children: Not on file   Years of education: Not on file   Highest education level: Not on file  Occupational History   Not on file  Tobacco Use   Smoking status: Never   Smokeless tobacco: Never  Vaping Use   Vaping status: Never Used  Substance and Sexual Activity   Alcohol use: Yes    Comment: 1 drink /week   Drug use: No    Comment: none since 2013 (prior marijuana)   Sexual activity: Yes    Partners: Male    Birth control/protection: Condom  Other Topics Concern   Not on file  Social  History Narrative   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   Married, 1 son, 2 cats, chickens   Social Drivers of Corporate investment banker Strain: Not on file  Food Insecurity: Not on file  Transportation Needs: Not on file  Physical Activity: Not on file  Stress: Not on file  Social Connections: Not on file     Family History: The patient's family history includes Breast cancer (age of onset: 60) in her sister; Cancer in her mother; Depression in her sister; Fibromyalgia in her father; Thyroid disease in her maternal grandmother, maternal uncle, and mother. There is no history of Diabetes or Heart disease.  ROS:   Please see the history of present illness.    All other systems reviewed and are negative.  EKGs/Labs/Other Studies Reviewed:    The following studies were reviewed today:  EKG Interpretation Date/Time:  Thursday July 10 2023 10:58:43 EDT Ventricular Rate:  72 PR Interval:  120 QRS Duration:  78 QT Interval:  394 QTC Calculation: 431 R Axis:   83  Text Interpretation: Normal sinus rhythm Normal ECG When compared with ECG of 23-Apr-2023 08:48, No significant change was found Confirmed by Belva Crome [phone]) on 07/10/2023 11:28:31 AM     Recent Labs: No results found for requested labs within last 365 days.  Recent Lipid Panel    Component Value Date/Time   CHOL 174 01/24/2011 1119   TRIG 46 01/24/2011 1119   HDL 64 01/24/2011 1119   CHOLHDL 2.7 01/24/2011 1119   VLDL 9 01/24/2011 1119   LDLCALC 101 (H) 01/24/2011 1119    Physical Exam:    VS:  BP 100/80   Pulse 72   Ht 5\' 5"  (1.651 m)   Wt 131 lb 0.6 oz (59.4 kg)   SpO2 99%   BMI 21.81 kg/m     Wt Readings from Last 3 Encounters:  07/10/23 131 lb 0.6 oz (59.4 kg)  06/08/20 128 lb 4.8 oz (58.2 kg)  06/06/20 128 lb 4.8 oz (58.2 kg)     GEN: Patient is in no acute distress HEENT: Normal NECK: No JVD; No carotid bruits LYMPHATICS: No  lymphadenopathy CARDIAC: S1 S2 regular, 2/6 systolic murmur at the apex. RESPIRATORY:  Clear to auscultation without rales, wheezing or rhonchi  ABDOMEN: Soft, non-tender, non-distended MUSCULOSKELETAL:  No edema; No deformity  SKIN: Warm and dry NEUROLOGIC:  Alert and oriented x 3 PSYCHIATRIC:  Normal affect    Signed, Garwin Brothers, MD  07/10/2023 11:29 AM    [facility]

## 2023-07-10 NOTE — Patient Instructions (Addendum)
 Medication Instructions:  Your physician recommends that you continue on your current medications as directed. Please refer to the Current Medication list given to you today.  *If you need a refill on your cardiac medications before your next appointment, please call your pharmacy*  Lab Work: Your physician recommends that you return for lab work in:   Labs: BMP, LFT, Lipid  If you have labs (blood work) drawn today and your tests are completely normal, you will receive your results only by: MyChart Message (if you have MyChart) OR A paper copy in the mail If you have any lab test that is abnormal or we need to change your treatment, we will call you to review the results.  Testing/Procedures: Your physician has requested that you have a stress echocardiogram. For further information please visit [email or web address] Please follow instruction sheet as given.  Please note: We ask at that you not bring children with you during ultrasound (echo/ vascular) testing. Due to room size and safety concerns, children are not allowed in the ultrasound rooms during exams. Our front office staff cannot provide observation of children in our lobby area while testing is being conducted. An adult accompanying a patient to their appointment will only be allowed in the ultrasound room at the discretion of the ultrasound technician under special circumstances. We apologize for any inconvenience.   Follow-Up: At [facility], you and your health needs are our priority.  As part of our continuing mission to provide you with exceptional heart care, our providers are all part of one team.  This team includes your primary Cardiologist (physician) and Advanced Practice Providers or APPs (Physician Assistants and Nurse Practitioners) who all work together to provide you with the care you need, when you need it.  Your next appointment:   1 year(s)  Provider:   Belva Crome, MD    We recommend signing up  for the patient portal called "MyChart".  Sign up information is provided on this After Visit Summary.  MyChart is used to connect with patients for Virtual Visits (Telemedicine).  Patients are able to view lab/test results, encounter notes, upcoming appointments, etc.  Non-urgent messages can be sent to your provider as well.   To learn more about what you can do with MyChart, go to [email or web address].   Other Instructions [employer] mobile

## 2023-07-24 LAB — BASIC METABOLIC PANEL WITH GFR
BUN/Creatinine Ratio: 17 (ref 9–23)
BUN: 16 mg/dL (ref 6–24)
CO2: 22 mmol/L (ref 20–29)
Calcium: 9.5 mg/dL (ref 8.7–10.2)
Chloride: 103 mmol/L (ref 96–106)
Creatinine, Ser: 0.95 mg/dL (ref 0.57–1.00)
Glucose: 89 mg/dL (ref 70–99)
Potassium: 4.4 mmol/L (ref 3.5–5.2)
Sodium: 140 mmol/L (ref 134–144)
eGFR: 75 mL/min/{1.73_m2} (ref >59)

## 2023-07-24 LAB — LIPID PANEL
Chol/HDL Ratio: 2.5 ratio (ref 0.0–4.4)
Cholesterol, Total: 193 mg/dL (ref 100–199)
HDL: 76 mg/dL (ref >39)
LDL Chol Calc (NIH): 106 mg/dL — ABNORMAL HIGH (ref 0–99)
Triglycerides: 59 mg/dL (ref 0–149)
VLDL Cholesterol Cal: 11 mg/dL (ref 5–40)

## 2023-07-24 LAB — HEPATIC FUNCTION PANEL
ALT: 13 IU/L (ref 0–32)
AST: 15 IU/L (ref 0–40)
Albumin: 4.3 g/dL (ref 3.9–4.9)
Alkaline Phosphatase: 59 IU/L (ref 44–121)
Bilirubin Total: 0.5 mg/dL (ref 0.0–1.2)
Bilirubin, Direct: 0.16 mg/dL (ref 0.00–0.40)
Total Protein: 7.8 g/dL (ref 6.0–8.5)

## 2023-08-07 ENCOUNTER — Telehealth: Payer: Self-pay | Admitting: *Deleted

## 2023-08-07 NOTE — Telephone Encounter (Signed)
 Left detailed instructions foe stress echo on vm per DPR.

## 2023-08-13 NOTE — Addendum Note (Signed)
 Addended by: Einar Grave on: 08/13/2023 01:29 PM   Modules accepted: Orders

## 2023-08-14 ENCOUNTER — Ambulatory Visit (HOSPITAL_COMMUNITY)

## 2023-08-14 ENCOUNTER — Telehealth: Payer: Self-pay

## 2023-08-14 ENCOUNTER — Telehealth: Payer: Self-pay | Admitting: Cardiology

## 2023-08-14 DIAGNOSIS — Z8249 Family history of ischemic heart disease and other diseases of the circulatory system: Secondary | ICD-10-CM

## 2023-08-14 DIAGNOSIS — R079 Chest pain, unspecified: Secondary | ICD-10-CM

## 2023-08-14 NOTE — Telephone Encounter (Signed)
 Homocystein level and CA score orders entered per Dr. Lafayette Pierre at pts request.

## 2023-08-14 NOTE — Telephone Encounter (Signed)
 Pt called stating that she saw her [facility] Provider and they suggested she have a Coronary Calcium Score test and a Homocysteine level. She is wanting to know if you would order this for her?

## 2023-08-14 NOTE — Addendum Note (Signed)
 Addended by: Shawnee Dellen D on: 08/14/2023 03:56 PM   Modules accepted: Orders

## 2023-08-14 NOTE — Telephone Encounter (Signed)
 Pt calling to inquire about a Calcium Score Test as well as another test being ordered per her [facility] provider. Pt would like a c/b regarding this matter. Please advise

## 2023-08-22 ENCOUNTER — Telehealth (HOSPITAL_COMMUNITY): Payer: Self-pay | Admitting: *Deleted

## 2023-08-22 NOTE — Telephone Encounter (Signed)
 Reminder call given for upcoming ETT on 08/29/23 at 8:30.

## 2023-08-29 ENCOUNTER — Ambulatory Visit (HOSPITAL_COMMUNITY)
Admission: RE | Admit: 2023-08-29 | Discharge: 2023-08-29 | Disposition: A | Discharge status: Home / Self Care | Source: Ambulatory Visit | Attending: Cardiology | Admitting: Cardiology

## 2023-08-29 DIAGNOSIS — Z8249 Family history of ischemic heart disease and other diseases of the circulatory system: Secondary | ICD-10-CM | POA: Diagnosis not present

## 2023-08-29 DIAGNOSIS — R079 Chest pain, unspecified: Secondary | ICD-10-CM | POA: Insufficient documentation

## 2023-08-29 LAB — EXERCISE TOLERANCE TEST
Angina Index: 0
Duke Treadmill Score: 10
Estimated workload: 11.7
Exercise duration (min): 10 min
Exercise duration (sec): 0 s
MPHR: 175 {beats}/min
Peak HR: 166 {beats}/min
Percent HR: 94 %
Rest HR: 93 {beats}/min
ST Depression (mm): 0 mm

## 2023-08-30 LAB — HOMOCYSTEINE: Homocysteine: 9.6 umol/L (ref 0.0–14.5)

## 2023-09-08 ENCOUNTER — Ambulatory Visit: Payer: Self-pay | Admitting: Cardiology

## 2023-12-11 DIAGNOSIS — H5213 Myopia, bilateral: Secondary | ICD-10-CM | POA: Diagnosis not present

## 2024-01-21 ENCOUNTER — Ambulatory Visit: Attending: Sports Medicine

## 2024-01-21 ENCOUNTER — Other Ambulatory Visit: Payer: Self-pay

## 2024-01-21 DIAGNOSIS — M2242 Chondromalacia patellae, left knee: Secondary | ICD-10-CM | POA: Diagnosis not present

## 2024-01-21 DIAGNOSIS — M6281 Muscle weakness (generalized): Secondary | ICD-10-CM | POA: Insufficient documentation

## 2024-01-21 DIAGNOSIS — G8929 Other chronic pain: Secondary | ICD-10-CM | POA: Insufficient documentation

## 2024-01-21 DIAGNOSIS — M25562 Pain in left knee: Secondary | ICD-10-CM | POA: Diagnosis not present

## 2024-01-21 NOTE — Therapy (Signed)
 OUTPATIENT PHYSICAL THERAPY LOWER EXTREMITY EVALUATION   Patient Name: Alexandra Sutton MRN: [id number] DOB:06/11/1975, 48 y.o., female Today's Date: 01/21/2024  END OF SESSION:  PT End of Session - 01/21/24 1315     Visit Number 1    Number of Visits 6    Date for Recertification  03/22/24    Authorization Type [place] MCD    PT Start Time 1055    PT Stop Time 1130    PT Time Calculation (min) 35 min    Activity Tolerance Patient tolerated treatment well    Behavior During Therapy [facility] for tasks assessed/performed          Past Medical History:  Diagnosis Date   Back pain 11/2011   low and mid-back s/p pregnancy; improved with PT   Infertility, female 01/24/2011   Nonspecific chest pain 07/08/2023   S/P myomectomy 06/08/2020   SI (sacroiliac) joint dysfunction    Uterine fibroid 06/08/2020   Past Surgical History:  Procedure Laterality Date   MYOMECTOMY N/A 06/08/2020   Procedure: ABDOMINAL MYOMECTOMY;  Surgeon: Barbette Knock, MD;  Location: [facility];  Service: Gynecology;  Laterality: N/A;  TAPP BLOCK   Patient Active Problem List   Diagnosis Date Noted   Nonspecific chest pain 07/08/2023   SI (sacroiliac) joint dysfunction    Uterine fibroid 06/08/2020   S/P myomectomy 06/08/2020   Back pain 11/2011   Infertility, female 01/24/2011    PCP: Obgyn, Anna   REFERRING PROVIDER: Orpha Asberry RAMAN, MD  REFERRING DIAG: [phone] (ICD-10-CM) - Chondromalacia of left knee  THERAPY DIAG:  Chondromalacia patellae of left knee  Muscle weakness (generalized)  Chronic pain of left knee  Rationale for Evaluation and Treatment: Rehabilitation  ONSET DATE: chronic  SUBJECTIVE:   SUBJECTIVE STATEMENT: Describes a history of L knee pain most noted with deep squats and loaded flexion positions.  Denies crepitus or patellar dislocations.  PERTINENT HISTORY: None available PAIN:  Are you having pain? Yes: NPRS scale: 6/10 Pain location: L knee Pain description:  ache Aggravating factors: deep squats Relieving factors: rest  PRECAUTIONS: None  RED FLAGS: None   WEIGHT BEARING RESTRICTIONS: No  FALLS:  Has patient fallen in last 6 months? No  OCCUPATION: yoga   PLOF: Independent  PATIENT GOALS: To manage my knee pain  NEXT MD VISIT: TBD  OBJECTIVE:  Note: Objective measures were completed at Evaluation unless otherwise noted.  DIAGNOSTIC FINDINGS: none available  PATIENT SURVEYS:  LEFS 63/80  MUSCLE LENGTH: Hamstrings: Right 90 deg; Left 90 deg  POSTURE: No Significant postural limitations  PALPATION: TTP across patellar fat pad  LOWER EXTREMITY ROM: WNL  Active ROM Right eval Left eval  Hip flexion    Hip extension    Hip abduction    Hip adduction    Hip internal rotation    Hip external rotation    Knee flexion    Knee extension    Ankle dorsiflexion    Ankle plantarflexion    Ankle inversion    Ankle eversion     (Blank rows = not tested)  LOWER EXTREMITY MMT:  MMT Right eval Left eval  Hip flexion    Hip extension    Hip abduction  4  Hip adduction    Hip internal rotation    Hip external rotation    Knee flexion    Knee extension  4  Ankle dorsiflexion    Ankle plantarflexion    Ankle inversion    Ankle eversion     (  Blank rows = not tested)  LOWER EXTREMITY SPECIAL TESTS:  Knee special tests: Patellafemoral apprehension test: negative, Lateral pull sign: positive , Patellafemoral grind test: negative, and Patella tap test (ballotable patella): negative  FUNCTIONAL TESTS:  30 seconds chair stand test 1  GAIT: Distance walked: 76ftx2 Assistive device utilized: None Level of assistance: Complete Independence Comments: unremarkable                                                                                                                                TREATMENT:  [facility] Adult PT Treatment:                                                DATE: 01/21/24  Self Care: Additional  minutes spent for educating on updated Therapeutic Home Exercise Program as well as comparing current status to condition at start of symptoms. This included exercises focusing on stretching, strengthening, with focus on eccentric aspects. Long term goals include an improvement in range of motion, strength, endurance as well as avoiding reinjury. Patient's frequency would include in 1-2 times a day, 3-5 times a week for a duration of 6-12 weeks. Proper technique shown and discussed handout in great detail. All questions were discussed and addressed.     PATIENT EDUCATION:  Education details: Discussed eval findings, rehab rationale and POC and patient is in agreement  Person educated: Patient Education method: Explanation and Handouts Education comprehension: verbalized understanding and needs further education  HOME EXERCISE PROGRAM: Access Code: [email or web address] URL: [email or web address] Date: 01/21/2024 Prepared by: Reyes Kohut  Exercises - Supine Quad Set  - 3-5 x daily - 5 x weekly - 1 sets - 10 reps - 3s hold - Small Range Straight Leg Raise  - 3-5 x daily - 5 x weekly - 1 sets - 10 reps - 3s hold  ASSESSMENT:  CLINICAL IMPRESSION: Patient is a 48 y.o. female who was seen today for physical therapy evaluation and treatment for L knee pain.  Patient presents with full AROM in R hip/knee, ligamentously stable throughout with TTP at anterior knee over patellar fat pad.  Positive lateral pull sign with active quad contraction, no patellar crepitus and a diminished L VMO contraction compared to R.  Patient would benefit from OPPT to minimize discomfort and improve VMO function and patellar tracking.  OBJECTIVE IMPAIRMENTS: decreased activity tolerance, decreased knowledge of condition, decreased strength, impaired perceived functional ability, and pain.   ACTIVITY LIMITATIONS: sitting, squatting, stairs, and yoga poses  PERSONAL FACTORS: Age, Fitness, and 1 comorbidity:  hypermobility are also affecting patient's functional outcome.   REHAB POTENTIAL: Good  CLINICAL DECISION MAKING: Stable/uncomplicated  EVALUATION COMPLEXITY: Low   GOALS: Goals reviewed with patient? No    LONG TERM GOALS: Target date: 03/22/24  Patient will acknowledge 4/10 pain at least once  during episode of care   Baseline: 6/10 Goal status: INITIAL  2.  Patient will score at least 73/80 on LEFS to signify clinically meaningful improvement in functional abilities.   Baseline: 63/80 Goal status: INITIAL  3.  Patient will increase 30s chair stand reps from 1 to 8 without arms to demonstrate and improved functional ability with less pain/difficulty as well as reduce fall risk.  Baseline: 1 Goal status: INITIAL  4.  Patient to demonstrate independence in HEP  Baseline: [id number] Goal status: INITIAL  5.  4+/5 L hip and knee strength Baseline: 4/5 Goal status: INITIAL  6.  Resolve lateral pull sign L Baseline: + L lateral pull sign Goal status: INITIAL   PLAN:  PT FREQUENCY: 1-2x/week  PT DURATION: 6 weeks  PLANNED INTERVENTIONS: 97110-Therapeutic exercises, 97530- Therapeutic activity, [id number]- Neuromuscular re-education, 97535- Self Care, [id number]- Manual therapy, [phone]- Gait training, Patient/Family education, Balance training, Stair training, and Taping  PLAN FOR NEXT SESSION: HEP review and update, manual techniques as appropriate, aerobic tasks, ROM and flexibility activities, strengthening and PREs, TPDN, gait and balance training as needed    For all possible CPT codes, reference the Planned Interventions line above.     Check all conditions that are expected to impact treatment: {Conditions expected to impact treatment:None of these apply   If treatment provided at initial evaluation, no treatment charged due to lack of authorization.       Maretta Overdorf M Darran Gabay, PT 01/21/2024, 1:19 PM

## 2024-02-04 NOTE — Therapy (Unsigned)
 OUTPATIENT PHYSICAL THERAPY TREATMENT NOTE   Patient Name: Alexandra Sutton MRN: [id number] DOB:1975/09/10, 48 y.o., female Today's Date: 02/06/2024  END OF SESSION:  PT End of Session - 02/06/24 1054     Visit Number 2    Number of Visits 6    Date for Recertification  03/22/24    Authorization Type [place] MCD    Authorization Time Period approved 5 PT visits from 01/21/24-03/08/24    Authorization - Visit Number 2    Authorization - Number of Visits 5    PT Start Time 1000    PT Stop Time 1045    PT Time Calculation (min) 45 min    Activity Tolerance Patient tolerated treatment well    Behavior During Therapy [facility] for tasks assessed/performed           Past Medical History:  Diagnosis Date   Back pain 11/2011   low and mid-back s/p pregnancy; improved with PT   Infertility, female 01/24/2011   Nonspecific chest pain 07/08/2023   S/P myomectomy 06/08/2020   SI (sacroiliac) joint dysfunction    Uterine fibroid 06/08/2020   Past Surgical History:  Procedure Laterality Date   MYOMECTOMY N/A 06/08/2020   Procedure: ABDOMINAL MYOMECTOMY;  Surgeon: Barbette Knock, MD;  Location: [facility] OR;  Service: Gynecology;  Laterality: N/A;  TAPP BLOCK   Patient Active Problem List   Diagnosis Date Noted   Nonspecific chest pain 07/08/2023   SI (sacroiliac) joint dysfunction    Uterine fibroid 06/08/2020   S/P myomectomy 06/08/2020   Back pain 11/2011   Infertility, female 01/24/2011    PCP: Obgyn, Anna   REFERRING PROVIDER: Orpha Asberry RAMAN, MD  REFERRING DIAG: [phone] (ICD-10-CM) - Chondromalacia of left knee  THERAPY DIAG:  Chondromalacia patellae of left knee  Muscle weakness (generalized)  Chronic pain of left knee  Rationale for Evaluation and Treatment: Rehabilitation  ONSET DATE: chronic  SUBJECTIVE:   SUBJECTIVE STATEMENT:  Has been compliant with HEP.   PERTINENT HISTORY: None available PAIN:  Are you having pain? Yes: NPRS scale: 6/10 Pain location:  L knee Pain description: ache Aggravating factors: deep squats Relieving factors: rest  PRECAUTIONS: None  RED FLAGS: None   WEIGHT BEARING RESTRICTIONS: No  FALLS:  Has patient fallen in last 6 months? No  OCCUPATION: yoga   PLOF: Independent  PATIENT GOALS: To manage my knee pain  NEXT MD VISIT: TBD  OBJECTIVE:  Note: Objective measures were completed at Evaluation unless otherwise noted.  DIAGNOSTIC FINDINGS: none available  PATIENT SURVEYS:  LEFS 63/80  MUSCLE LENGTH: Hamstrings: Right 90 deg; Left 90 deg  POSTURE: No Significant postural limitations  PALPATION: TTP across patellar fat pad  LOWER EXTREMITY ROM: WNL  Active ROM Right eval Left eval  Hip flexion    Hip extension    Hip abduction    Hip adduction    Hip internal rotation    Hip external rotation    Knee flexion    Knee extension    Ankle dorsiflexion    Ankle plantarflexion    Ankle inversion    Ankle eversion     (Blank rows = not tested)  LOWER EXTREMITY MMT:  MMT Right eval Left eval  Hip flexion    Hip extension    Hip abduction  4  Hip adduction    Hip internal rotation    Hip external rotation    Knee flexion    Knee extension  4  Ankle dorsiflexion  Ankle plantarflexion    Ankle inversion    Ankle eversion     (Blank rows = not tested)  LOWER EXTREMITY SPECIAL TESTS:  Knee special tests: Patellafemoral apprehension test: negative, Lateral pull sign: positive , Patellafemoral grind test: negative, and Patella tap test (ballotable patella): negative  FUNCTIONAL TESTS:  30 seconds chair stand test 1  GAIT: Distance walked: 51ftx2 Assistive device utilized: None Level of assistance: Complete Independence Comments: unremarkable                                                                                                                                TREATMENT:  [facility] Adult PT Treatment:                                                DATE:  02/06/24 Therapeutic Exercise: Recumbent bike 8 min Neuromuscular re-ed: FAQs with adduction 15x Supine hip fallouts BluTB 15x B, 15/15 S/L clams BluTB 51/15 Therapeutic Activity: Standing TKE BluTB 15x Quad set 3s hold SLR 2#15x SAQ 5# 15x emphasis on eccentric component L ITB Stretch 30s x2  [facility] Adult PT Treatment:                                                DATE: 01/21/24  Self Care: Additional minutes spent for educating on updated Therapeutic Home Exercise Program as well as comparing current status to condition at start of symptoms. This included exercises focusing on stretching, strengthening, with focus on eccentric aspects. Long term goals include an improvement in range of motion, strength, endurance as well as avoiding reinjury. Patient's frequency would include in 1-2 times a day, 3-5 times a week for a duration of 6-12 weeks. Proper technique shown and discussed handout in great detail. All questions were discussed and addressed.     PATIENT EDUCATION:  Education details: Discussed eval findings, rehab rationale and POC and patient is in agreement  Person educated: Patient Education method: Explanation and Handouts Education comprehension: verbalized understanding and needs further education  HOME EXERCISE PROGRAM: Access Code: [email or web address] URL: [email or web address] Date: 02/06/2024 Prepared by: Reyes Kohut  Exercises - Supine Quad Set  - 3-5 x daily - 5 x weekly - 1 sets - 10 reps - 3s hold - Small Range Straight Leg Raise  - 3-5 x daily - 5 x weekly - 1 sets - 10 reps - 3s hold - Hooklying Single Leg Bent Knee Fallouts with Resistance  - 1 x daily - 3 x weekly - 2 sets - 15 reps - Clamshell with Resistance  - 1 x daily - 3 x weekly - 2 sets - 15 reps - Standing Terminal Knee Extension with Resistance  -  1 x daily - 3 x weekly - 2 sets - 15 reps  ASSESSMENT:  CLINICAL IMPRESSION:  Patient returns for first f/u session and has been compliant with  HEP w/o issues.  Relates a history of R SI joint dysfunction and R hip issues.  Focus of today was TKE training, aerobic w/u and focus on hip control, strength and stability tasks.  Patient is a 48 y.o. female who was seen today for physical therapy evaluation and treatment for L knee pain.  Patient presents with full AROM in R hip/knee, ligamentously stable throughout with TTP at anterior knee over patellar fat pad.  Positive lateral pull sign with active quad contraction, no patellar crepitus and a diminished L VMO contraction compared to R.  Patient would benefit from OPPT to minimize discomfort and improve VMO function and patellar tracking.  OBJECTIVE IMPAIRMENTS: decreased activity tolerance, decreased knowledge of condition, decreased strength, impaired perceived functional ability, and pain.   ACTIVITY LIMITATIONS: sitting, squatting, stairs, and yoga poses  PERSONAL FACTORS: Age, Fitness, and 1 comorbidity: hypermobility are also affecting patient's functional outcome.   REHAB POTENTIAL: Good  CLINICAL DECISION MAKING: Stable/uncomplicated  EVALUATION COMPLEXITY: Low   GOALS: Goals reviewed with patient? No    LONG TERM GOALS: Target date: 03/22/24  Patient will acknowledge 4/10 pain at least once during episode of care   Baseline: 6/10 Goal status: INITIAL  2.  Patient will score at least 73/80 on LEFS to signify clinically meaningful improvement in functional abilities.   Baseline: 63/80 Goal status: INITIAL  3.  Patient will increase 30s chair stand reps from 1 to 8 without arms to demonstrate and improved functional ability with less pain/difficulty as well as reduce fall risk.  Baseline: 1 Goal status: INITIAL  4.  Patient to demonstrate independence in HEP  Baseline: [id number] Goal status: INITIAL  5.  4+/5 L hip and knee strength Baseline: 4/5 Goal status: INITIAL  6.  Resolve lateral pull sign L Baseline: + L lateral pull sign Goal status:  INITIAL   PLAN:  PT FREQUENCY: 1-2x/week  PT DURATION: 6 weeks  PLANNED INTERVENTIONS: 97110-Therapeutic exercises, 97530- Therapeutic activity, [id number]- Neuromuscular re-education, 97535- Self Care, [id number]- Manual therapy, [phone]- Gait training, Patient/Family education, Balance training, Stair training, and Taping  PLAN FOR NEXT SESSION: HEP review and update, manual techniques as appropriate, aerobic tasks, ROM and flexibility activities, strengthening and PREs, TPDN, gait and balance training as needed    For all possible CPT codes, reference the Planned Interventions line above.     Check all conditions that are expected to impact treatment: {Conditions expected to impact treatment:None of these apply   If treatment provided at initial evaluation, no treatment charged due to lack of authorization.       Alexsia Klindt M Cassara Nida, PT 02/06/2024, 11:01 AM

## 2024-02-06 ENCOUNTER — Ambulatory Visit

## 2024-02-06 DIAGNOSIS — M2242 Chondromalacia patellae, left knee: Secondary | ICD-10-CM | POA: Diagnosis not present

## 2024-02-06 DIAGNOSIS — M6281 Muscle weakness (generalized): Secondary | ICD-10-CM | POA: Diagnosis not present

## 2024-02-06 DIAGNOSIS — G8929 Other chronic pain: Secondary | ICD-10-CM | POA: Diagnosis not present

## 2024-02-06 DIAGNOSIS — M25562 Pain in left knee: Secondary | ICD-10-CM | POA: Diagnosis not present

## 2024-02-10 NOTE — Therapy (Unsigned)
 OUTPATIENT PHYSICAL THERAPY TREATMENT NOTE   Patient Name: Alexandra Sutton MRN: [id number] DOB:11-15-1975, 48 y.o., female Today's Date: 02/11/2024  END OF SESSION:  PT End of Session - 02/11/24 0919     Visit Number 3    Number of Visits 6    Date for Recertification  03/22/24    Authorization Type [place] MCD    Authorization Time Period approved 5 PT visits from 01/21/24-03/08/24    Authorization - Visit Number 3    Authorization - Number of Visits 5    PT Start Time 0915    PT Stop Time 1015    PT Time Calculation (min) 60 min    Activity Tolerance Patient tolerated treatment well    Behavior During Therapy [facility] for tasks assessed/performed            Past Medical History:  Diagnosis Date   Back pain 11/2011   low and mid-back s/p pregnancy; improved with PT   Infertility, female 01/24/2011   Nonspecific chest pain 07/08/2023   S/P myomectomy 06/08/2020   SI (sacroiliac) joint dysfunction    Uterine fibroid 06/08/2020   Past Surgical History:  Procedure Laterality Date   MYOMECTOMY N/A 06/08/2020   Procedure: ABDOMINAL MYOMECTOMY;  Surgeon: Barbette Knock, MD;  Location: [facility] OR;  Service: Gynecology;  Laterality: N/A;  TAPP BLOCK   Patient Active Problem List   Diagnosis Date Noted   Nonspecific chest pain 07/08/2023   SI (sacroiliac) joint dysfunction    Uterine fibroid 06/08/2020   S/P myomectomy 06/08/2020   Back pain 11/2011   Infertility, female 01/24/2011    PCP: Obgyn, Anna   REFERRING PROVIDER: Orpha Asberry RAMAN, MD  REFERRING DIAG: [phone] (ICD-10-CM) - Chondromalacia of left knee  THERAPY DIAG:  Chondromalacia patellae of left knee  Muscle weakness (generalized)  Chronic pain of left knee  Rationale for Evaluation and Treatment: Rehabilitation  ONSET DATE: chronic  SUBJECTIVE:   SUBJECTIVE STATEMENT:  main issue is maintaining deep squats and childs pose due to L knee pain   PERTINENT HISTORY: None available PAIN:  Are you having  pain? Yes: NPRS scale: 6/10 Pain location: L knee Pain description: ache Aggravating factors: deep squats Relieving factors: rest  PRECAUTIONS: None  RED FLAGS: None   WEIGHT BEARING RESTRICTIONS: No  FALLS:  Has patient fallen in last 6 months? No  OCCUPATION: yoga   PLOF: Independent  PATIENT GOALS: To manage my knee pain  NEXT MD VISIT: TBD  OBJECTIVE:  Note: Objective measures were completed at Evaluation unless otherwise noted.  DIAGNOSTIC FINDINGS: none available  PATIENT SURVEYS:  LEFS 63/80  MUSCLE LENGTH: Hamstrings: Right 90 deg; Left 90 deg  POSTURE: No Significant postural limitations  PALPATION: TTP across patellar fat pad  LOWER EXTREMITY ROM: WNL  Active ROM Right eval Left eval  Hip flexion    Hip extension    Hip abduction    Hip adduction    Hip internal rotation    Hip external rotation    Knee flexion    Knee extension    Ankle dorsiflexion    Ankle plantarflexion    Ankle inversion    Ankle eversion     (Blank rows = not tested)  LOWER EXTREMITY MMT:  MMT Right eval Left eval  Hip flexion    Hip extension    Hip abduction  4  Hip adduction    Hip internal rotation    Hip external rotation    Knee flexion  Knee extension  4  Ankle dorsiflexion    Ankle plantarflexion    Ankle inversion    Ankle eversion     (Blank rows = not tested)  LOWER EXTREMITY SPECIAL TESTS:  Knee special tests: Patellafemoral apprehension test: negative, Lateral pull sign: positive , Patellafemoral grind test: negative, and Patella tap test (ballotable patella): negative  FUNCTIONAL TESTS:  30 seconds chair stand test 1  GAIT: Distance walked: 35ftx2 Assistive device utilized: None Level of assistance: Complete Independence Comments: unremarkable                                                                                                                                TREATMENT:  [facility] Adult PT Treatment:                                                 DATE: 02/11/24 Therapeutic Exercise: Nustep L4 8 min 60-80 SPM Neuromuscular re-ed: Supine hip fallouts BlaTB 15x B, 15/15 S/L clams BlaTB 15/15 Bridge against BlaTB 15x Bridge w/ball 15x Therapeutic Activity: Runners step 4 in 15/15 to assess hip strength SL heel raise from 4 in step 15/15 Sidestepping against red band 2 trips   [facility] Adult PT Treatment:                                                DATE: 02/06/24 Therapeutic Exercise: Recumbent bike 8 min Neuromuscular re-ed: FAQs with adduction 15x Supine hip fallouts BluTB 15x B, 15/15 S/L clams BluTB 51/15 Therapeutic Activity: Standing TKE BluTB 15x Quad set 3s hold SLR 2#15x SAQ 5# 15x emphasis on eccentric component L ITB Stretch 30s x2  [facility] Adult PT Treatment:                                                DATE: 01/21/24  Self Care: Additional minutes spent for educating on updated Therapeutic Home Exercise Program as well as comparing current status to condition at start of symptoms. This included exercises focusing on stretching, strengthening, with focus on eccentric aspects. Long term goals include an improvement in range of motion, strength, endurance as well as avoiding reinjury. Patient's frequency would include in 1-2 times a day, 3-5 times a week for a duration of 6-12 weeks. Proper technique shown and discussed handout in great detail. All questions were discussed and addressed.     PATIENT EDUCATION:  Education details: Discussed eval findings, rehab rationale and POC and patient is in agreement  Person educated: Patient Education method: Explanation and Handouts Education comprehension: verbalized  understanding and needs further education  HOME EXERCISE PROGRAM: Access Code: [email or web address] URL: [email or web address] Date: 02/06/2024 Prepared by: Reyes Kohut  Exercises - Supine Quad Set  - 3-5 x daily - 5 x weekly - 1 sets - 10 reps - 3s hold - Small Range Straight  Leg Raise  - 3-5 x daily - 5 x weekly - 1 sets - 10 reps - 3s hold - Hooklying Single Leg Bent Knee Fallouts with Resistance  - 1 x daily - 3 x weekly - 2 sets - 15 reps - Clamshell with Resistance  - 1 x daily - 3 x weekly - 2 sets - 15 reps - Standing Terminal Knee Extension with Resistance  - 1 x daily - 3 x weekly - 2 sets - 15 reps  ASSESSMENT:  CLINICAL IMPRESSION:  No new c/o .  Incorporated bridging tasks into program to perform as part of HEP.  Assessed function in CKC tasks with weakness/instability noted in B hips and core.  RLE posterior chain weakness suspected by observation of SL heel raise.  HEP updated to reflect progress and address remaining deficits  Patient is a 48 y.o. female who was seen today for physical therapy evaluation and treatment for L knee pain.  Patient presents with full AROM in R hip/knee, ligamentously stable throughout with TTP at anterior knee over patellar fat pad.  Positive lateral pull sign with active quad contraction, no patellar crepitus and a diminished L VMO contraction compared to R.  Patient would benefit from OPPT to minimize discomfort and improve VMO function and patellar tracking.  OBJECTIVE IMPAIRMENTS: decreased activity tolerance, decreased knowledge of condition, decreased strength, impaired perceived functional ability, and pain.   ACTIVITY LIMITATIONS: sitting, squatting, stairs, and yoga poses  PERSONAL FACTORS: Age, Fitness, and 1 comorbidity: hypermobility are also affecting patient's functional outcome.   REHAB POTENTIAL: Good  CLINICAL DECISION MAKING: Stable/uncomplicated  EVALUATION COMPLEXITY: Low   GOALS: Goals reviewed with patient? No    SHORT TERM GOALS=LONG TERM GOALS: Target date: 03/22/24  Patient will acknowledge 4/10 pain at least once during episode of care   Baseline: 6/10 Goal status: INITIAL  2.  Patient will score at least 73/80 on LEFS to signify clinically meaningful improvement in functional  abilities.   Baseline: 63/80 Goal status: INITIAL  3.  Patient will increase 30s chair stand reps from 1 to 8 without arms to demonstrate and improved functional ability with less pain/difficulty as well as reduce fall risk.  Baseline: 1 Goal status: INITIAL  4.  Patient to demonstrate independence in HEP  Baseline: [id number] Goal status: INITIAL  5.  4+/5 L hip and knee strength Baseline: 4/5 Goal status: INITIAL  6.  Resolve lateral pull sign L Baseline: + L lateral pull sign Goal status: INITIAL   PLAN:  PT FREQUENCY: 1-2x/week  PT DURATION: 6 weeks  PLANNED INTERVENTIONS: 97110-Therapeutic exercises, 97530- Therapeutic activity, [id number]- Neuromuscular re-education, 97535- Self Care, 02859- Manual therapy, [phone]- Gait training, Patient/Family education, Balance training, Stair training, and Taping  PLAN FOR NEXT SESSION: HEP review and update, manual techniques as appropriate, aerobic tasks, ROM and flexibility activities, strengthening and PREs, TPDN, gait and balance training as needed    For all possible CPT codes, reference the Planned Interventions line above.     Check all conditions that are expected to impact treatment: {Conditions expected to impact treatment:None of these apply   If treatment provided at initial evaluation, no treatment charged due to lack of authorization.  Yelina Sarratt M Nikai Quest, PT 02/11/2024, 10:27 AM

## 2024-02-11 ENCOUNTER — Ambulatory Visit: Attending: Sports Medicine

## 2024-02-11 DIAGNOSIS — G8929 Other chronic pain: Secondary | ICD-10-CM | POA: Insufficient documentation

## 2024-02-11 DIAGNOSIS — M2242 Chondromalacia patellae, left knee: Secondary | ICD-10-CM | POA: Insufficient documentation

## 2024-02-11 DIAGNOSIS — M6281 Muscle weakness (generalized): Secondary | ICD-10-CM | POA: Diagnosis not present

## 2024-02-11 DIAGNOSIS — M25562 Pain in left knee: Secondary | ICD-10-CM | POA: Diagnosis not present

## 2024-02-18 NOTE — Therapy (Deleted)
 OUTPATIENT PHYSICAL THERAPY TREATMENT NOTE   Patient Name: Alexandra Sutton MRN: [id number] DOB:1975-07-28, 48 y.o., female Today's Date: 02/18/2024  END OF SESSION:      Past Medical History:  Diagnosis Date   Back pain 11/2011   low and mid-back s/p pregnancy; improved with PT   Infertility, female 01/24/2011   Nonspecific chest pain 07/08/2023   S/P myomectomy 06/08/2020   SI (sacroiliac) joint dysfunction    Uterine fibroid 06/08/2020   Past Surgical History:  Procedure Laterality Date   MYOMECTOMY N/A 06/08/2020   Procedure: ABDOMINAL MYOMECTOMY;  Surgeon: Barbette Knock, MD;  Location: [facility] OR;  Service: Gynecology;  Laterality: N/A;  TAPP BLOCK   Patient Active Problem List   Diagnosis Date Noted   Nonspecific chest pain 07/08/2023   SI (sacroiliac) joint dysfunction    Uterine fibroid 06/08/2020   S/P myomectomy 06/08/2020   Back pain 11/2011   Infertility, female 01/24/2011    PCP: Obgyn, Anna   REFERRING PROVIDER: Orpha Asberry RAMAN, MD  REFERRING DIAG: [phone] (ICD-10-CM) - Chondromalacia of left knee  THERAPY DIAG:  No diagnosis found.  Rationale for Evaluation and Treatment: Rehabilitation  ONSET DATE: chronic  SUBJECTIVE:   SUBJECTIVE STATEMENT:  main issue is maintaining deep squats and childs pose due to L knee pain   PERTINENT HISTORY: None available PAIN:  Are you having pain? Yes: NPRS scale: 6/10 Pain location: L knee Pain description: ache Aggravating factors: deep squats Relieving factors: rest  PRECAUTIONS: None  RED FLAGS: None   WEIGHT BEARING RESTRICTIONS: No  FALLS:  Has patient fallen in last 6 months? No  OCCUPATION: yoga   PLOF: Independent  PATIENT GOALS: To manage my knee pain  NEXT MD VISIT: TBD  OBJECTIVE:  Note: Objective measures were completed at Evaluation unless otherwise noted.  DIAGNOSTIC FINDINGS: none available  PATIENT SURVEYS:  LEFS 63/80  MUSCLE LENGTH: Hamstrings: Right 90 deg;  Left 90 deg  POSTURE: No Significant postural limitations  PALPATION: TTP across patellar fat pad  LOWER EXTREMITY ROM: WNL  Active ROM Right eval Left eval  Hip flexion    Hip extension    Hip abduction    Hip adduction    Hip internal rotation    Hip external rotation    Knee flexion    Knee extension    Ankle dorsiflexion    Ankle plantarflexion    Ankle inversion    Ankle eversion     (Blank rows = not tested)  LOWER EXTREMITY MMT:  MMT Right eval Left eval  Hip flexion    Hip extension    Hip abduction  4  Hip adduction    Hip internal rotation    Hip external rotation    Knee flexion    Knee extension  4  Ankle dorsiflexion    Ankle plantarflexion    Ankle inversion    Ankle eversion     (Blank rows = not tested)  LOWER EXTREMITY SPECIAL TESTS:  Knee special tests: Patellafemoral apprehension test: negative, Lateral pull sign: positive , Patellafemoral grind test: negative, and Patella tap test (ballotable patella): negative  FUNCTIONAL TESTS:  30 seconds chair stand test 1  GAIT: Distance walked: 44ftx2 Assistive device utilized: None Level of assistance: Complete Independence Comments: unremarkable  TREATMENT:  [facility] Adult PT Treatment:                                                DATE: 02/11/24 Therapeutic Exercise: Nustep L4 8 min 60-80 SPM Neuromuscular re-ed: Supine hip fallouts BlaTB 15x B, 15/15 S/L clams BlaTB 15/15 Bridge against BlaTB 15x Bridge w/ball 15x Therapeutic Activity: Runners step 4 in 15/15 to assess hip strength SL heel raise from 4 in step 15/15 Sidestepping against red band 2 trips   [facility] Adult PT Treatment:                                                DATE: 02/06/24 Therapeutic Exercise: Recumbent bike 8 min Neuromuscular re-ed: FAQs with adduction 15x Supine hip fallouts BluTB 15x  B, 15/15 S/L clams BluTB 51/15 Therapeutic Activity: Standing TKE BluTB 15x Quad set 3s hold SLR 2#15x SAQ 5# 15x emphasis on eccentric component L ITB Stretch 30s x2  [facility] Adult PT Treatment:                                                DATE: 01/21/24  Self Care: Additional minutes spent for educating on updated Therapeutic Home Exercise Program as well as comparing current status to condition at start of symptoms. This included exercises focusing on stretching, strengthening, with focus on eccentric aspects. Long term goals include an improvement in range of motion, strength, endurance as well as avoiding reinjury. Patient's frequency would include in 1-2 times a day, 3-5 times a week for a duration of 6-12 weeks. Proper technique shown and discussed handout in great detail. All questions were discussed and addressed.     PATIENT EDUCATION:  Education details: Discussed eval findings, rehab rationale and POC and patient is in agreement  Person educated: Patient Education method: Explanation and Handouts Education comprehension: verbalized understanding and needs further education  HOME EXERCISE PROGRAM: Access Code: [email or web address] URL: [email or web address] Date: 02/06/2024 Prepared by: Reyes Kohut  Exercises - Supine Quad Set  - 3-5 x daily - 5 x weekly - 1 sets - 10 reps - 3s hold - Small Range Straight Leg Raise  - 3-5 x daily - 5 x weekly - 1 sets - 10 reps - 3s hold - Hooklying Single Leg Bent Knee Fallouts with Resistance  - 1 x daily - 3 x weekly - 2 sets - 15 reps - Clamshell with Resistance  - 1 x daily - 3 x weekly - 2 sets - 15 reps - Standing Terminal Knee Extension with Resistance  - 1 x daily - 3 x weekly - 2 sets - 15 reps  ASSESSMENT:  CLINICAL IMPRESSION:  No new c/o .  Incorporated bridging tasks into program to perform as part of HEP.  Assessed function in CKC tasks with weakness/instability noted in B hips and core.  RLE posterior chain weakness  suspected by observation of SL heel raise.  HEP updated to reflect progress and address remaining deficits  Patient is a 48 y.o. female who was seen today for physical therapy evaluation and treatment for L knee pain.  Patient presents with full AROM in R hip/knee, ligamentously stable throughout with TTP at anterior knee over patellar fat pad.  Positive lateral pull sign with active quad contraction, no patellar crepitus and a diminished L VMO contraction compared to R.  Patient would benefit from OPPT to minimize discomfort and improve VMO function and patellar tracking.  OBJECTIVE IMPAIRMENTS: decreased activity tolerance, decreased knowledge of condition, decreased strength, impaired perceived functional ability, and pain.   ACTIVITY LIMITATIONS: sitting, squatting, stairs, and yoga poses  PERSONAL FACTORS: Age, Fitness, and 1 comorbidity: hypermobility are also affecting patient's functional outcome.   REHAB POTENTIAL: Good  CLINICAL DECISION MAKING: Stable/uncomplicated  EVALUATION COMPLEXITY: Low   GOALS: Goals reviewed with patient? No    SHORT TERM GOALS=LONG TERM GOALS: Target date: 03/22/24  Patient will acknowledge 4/10 pain at least once during episode of care   Baseline: 6/10 Goal status: INITIAL  2.  Patient will score at least 73/80 on LEFS to signify clinically meaningful improvement in functional abilities.   Baseline: 63/80 Goal status: INITIAL  3.  Patient will increase 30s chair stand reps from 1 to 8 without arms to demonstrate and improved functional ability with less pain/difficulty as well as reduce fall risk.  Baseline: 1 Goal status: INITIAL  4.  Patient to demonstrate independence in HEP  Baseline: [id number] Goal status: INITIAL  5.  4+/5 L hip and knee strength Baseline: 4/5 Goal status: INITIAL  6.  Resolve lateral pull sign L Baseline: + L lateral pull sign Goal status: INITIAL   PLAN:  PT FREQUENCY: 1-2x/week  PT DURATION: 6  weeks  PLANNED INTERVENTIONS: 97110-Therapeutic exercises, 97530- Therapeutic activity, [id number]- Neuromuscular re-education, 97535- Self Care, 02859- Manual therapy, [phone]- Gait training, Patient/Family education, Balance training, Stair training, and Taping  PLAN FOR NEXT SESSION: HEP review and update, manual techniques as appropriate, aerobic tasks, ROM and flexibility activities, strengthening and PREs, TPDN, gait and balance training as needed    For all possible CPT codes, reference the Planned Interventions line above.     Check all conditions that are expected to impact treatment: {Conditions expected to impact treatment:None of these apply   If treatment provided at initial evaluation, no treatment charged due to lack of authorization.       Corby Villasenor M Emmory Solivan, PT 02/18/2024, 10:22 AM

## 2024-02-20 ENCOUNTER — Ambulatory Visit

## 2024-03-02 NOTE — Therapy (Unsigned)
 OUTPATIENT PHYSICAL THERAPY TREATMENT NOTE   Patient Name: Alexandra Sutton MRN: [id number] DOB:May 10, 1975, 48 y.o., female Today's Date: 03/03/2024  END OF SESSION:  PT End of Session - 03/03/24 1137     Visit Number 4    Number of Visits 6    Date for Recertification  03/22/24    Authorization Type [place] MCD    Authorization Time Period approved 5 PT visits from 01/21/24-03/08/24    Authorization - Visit Number 4    Authorization - Number of Visits 5    PT Start Time 1130    PT Stop Time 1210    PT Time Calculation (min) 40 min    Activity Tolerance Patient tolerated treatment well    Behavior During Therapy [facility] for tasks assessed/performed             Past Medical History:  Diagnosis Date   Back pain 11/2011   low and mid-back s/p pregnancy; improved with PT   Infertility, female 01/24/2011   Nonspecific chest pain 07/08/2023   S/P myomectomy 06/08/2020   SI (sacroiliac) joint dysfunction    Uterine fibroid 06/08/2020   Past Surgical History:  Procedure Laterality Date   MYOMECTOMY N/A 06/08/2020   Procedure: ABDOMINAL MYOMECTOMY;  Surgeon: Barbette Knock, MD;  Location: [facility] OR;  Service: Gynecology;  Laterality: N/A;  TAPP BLOCK   Patient Active Problem List   Diagnosis Date Noted   Nonspecific chest pain 07/08/2023   SI (sacroiliac) joint dysfunction    Uterine fibroid 06/08/2020   S/P myomectomy 06/08/2020   Back pain 11/2011   Infertility, female 01/24/2011    PCP: Obgyn, Anna   REFERRING PROVIDER: Orpha Asberry RAMAN, MD  REFERRING DIAG: [phone] (ICD-10-CM) - Chondromalacia of left knee  THERAPY DIAG:  Chondromalacia patellae of left knee  Muscle weakness (generalized)  Chronic pain of left knee  Rationale for Evaluation and Treatment: Rehabilitation  ONSET DATE: chronic  SUBJECTIVE:   SUBJECTIVE STATEMENT:  L knee pain improving, able to flex deeper into her childs pose position.   PERTINENT HISTORY: None available PAIN:  Are you  having pain? Yes: NPRS scale: 6/10 Pain location: L knee Pain description: ache Aggravating factors: deep squats Relieving factors: rest  PRECAUTIONS: None  RED FLAGS: None   WEIGHT BEARING RESTRICTIONS: No  FALLS:  Has patient fallen in last 6 months? No  OCCUPATION: yoga   PLOF: Independent  PATIENT GOALS: To manage my knee pain  NEXT MD VISIT: TBD  OBJECTIVE:  Note: Objective measures were completed at Evaluation unless otherwise noted.  DIAGNOSTIC FINDINGS: none available  PATIENT SURVEYS:  LEFS 63/80  MUSCLE LENGTH: Hamstrings: Right 90 deg; Left 90 deg  POSTURE: No Significant postural limitations  PALPATION: TTP across patellar fat pad  LOWER EXTREMITY ROM: WNL  Active ROM Right eval Left eval  Hip flexion    Hip extension    Hip abduction    Hip adduction    Hip internal rotation    Hip external rotation    Knee flexion    Knee extension    Ankle dorsiflexion    Ankle plantarflexion    Ankle inversion    Ankle eversion     (Blank rows = not tested)  LOWER EXTREMITY MMT:  MMT Right eval Left eval  Hip flexion    Hip extension    Hip abduction  4  Hip adduction    Hip internal rotation    Hip external rotation    Knee flexion  Knee extension  4  Ankle dorsiflexion    Ankle plantarflexion    Ankle inversion    Ankle eversion     (Blank rows = not tested)  LOWER EXTREMITY SPECIAL TESTS:  Knee special tests: Patellafemoral apprehension test: negative, Lateral pull sign: positive , Patellafemoral grind test: negative, and Patella tap test (ballotable patella): negative  FUNCTIONAL TESTS:  30 seconds chair stand test 1  GAIT: Distance walked: 22ftx2 Assistive device utilized: None Level of assistance: Complete Independence Comments: unremarkable                                                                                                                                TREATMENT:  [facility] Adult PT Treatment:                                                 DATE: 03/03/24 Therapeutic Exercise: Nustep L5 8 min  Neuromuscular re-ed: Alternating knee extension from bridge position 15/15 QS 3s hold 15x FAQ w/adduction 15x Therapeutic Activity: Plank on knees 30s Plank on knees with leg extension 30s B Plank on toes 30s Plank on toes with leg extension 30s B Side planks with abduction 30s B  [facility] Adult PT Treatment:                                                DATE: 02/11/24 Therapeutic Exercise: Nustep L4 8 min 60-80 SPM Neuromuscular re-ed: Supine hip fallouts BlaTB 15x B, 15/15 S/L clams BlaTB 15/15 Bridge against BlaTB 15x Bridge w/ball 15x Therapeutic Activity: Runners step 4 in 15/15 to assess hip strength SL heel raise from 4 in step 15/15 Sidestepping against red band 2 trips   [facility] Adult PT Treatment:                                                DATE: 02/06/24 Therapeutic Exercise: Recumbent bike 8 min Neuromuscular re-ed: FAQs with adduction 15x Supine hip fallouts BluTB 15x B, 15/15 S/L clams BluTB 51/15 Therapeutic Activity: Standing TKE BluTB 15x Quad set 3s hold SLR 2#15x SAQ 5# 15x emphasis on eccentric component L ITB Stretch 30s x2  [facility] Adult PT Treatment:                                                DATE: 01/21/24  Self Care: Additional minutes spent for educating on updated Therapeutic Home Exercise  Program as well as comparing current status to condition at start of symptoms. This included exercises focusing on stretching, strengthening, with focus on eccentric aspects. Long term goals include an improvement in range of motion, strength, endurance as well as avoiding reinjury. Patient's frequency would include in 1-2 times a day, 3-5 times a week for a duration of 6-12 weeks. Proper technique shown and discussed handout in great detail. All questions were discussed and addressed.     PATIENT EDUCATION:  Education details: Discussed eval findings, rehab rationale and POC  and patient is in agreement  Person educated: Patient Education method: Explanation and Handouts Education comprehension: verbalized understanding and needs further education  HOME EXERCISE PROGRAM: Access Code: [email or web address] URL: [email or web address] Date: 02/06/2024 Prepared by: Reyes Kohut  Exercises - Supine Quad Set  - 3-5 x daily - 5 x weekly - 1 sets - 10 reps - 3s hold - Small Range Straight Leg Raise  - 3-5 x daily - 5 x weekly - 1 sets - 10 reps - 3s hold - Hooklying Single Leg Bent Knee Fallouts with Resistance  - 1 x daily - 3 x weekly - 2 sets - 15 reps - Clamshell with Resistance  - 1 x daily - 3 x weekly - 2 sets - 15 reps - Standing Terminal Knee Extension with Resistance  - 1 x daily - 3 x weekly - 2 sets - 15 reps  ASSESSMENT:  CLINICAL IMPRESSION:  Improving quad set but continued latency in VMO but good VMO activation observed.  Advanced to more functional core tasks to stabilize lumbosacral region and provide foundation for LE activities.    Patient is a 48 y.o. female who was seen today for physical therapy evaluation and treatment for L knee pain.  Patient presents with full AROM in R hip/knee, ligamentously stable throughout with TTP at anterior knee over patellar fat pad.  Positive lateral pull sign with active quad contraction, no patellar crepitus and a diminished L VMO contraction compared to R.  Patient would benefit from OPPT to minimize discomfort and improve VMO function and patellar tracking.  OBJECTIVE IMPAIRMENTS: decreased activity tolerance, decreased knowledge of condition, decreased strength, impaired perceived functional ability, and pain.   ACTIVITY LIMITATIONS: sitting, squatting, stairs, and yoga poses  PERSONAL FACTORS: Age, Fitness, and 1 comorbidity: hypermobility are also affecting patient's functional outcome.   REHAB POTENTIAL: Good  CLINICAL DECISION MAKING: Stable/uncomplicated  EVALUATION COMPLEXITY:  Low   GOALS: Goals reviewed with patient? No    SHORT TERM GOALS=LONG TERM GOALS: Target date: 03/22/24  Patient will acknowledge 4/10 pain at least once during episode of care   Baseline: 6/10 Goal status: INITIAL  2.  Patient will score at least 73/80 on LEFS to signify clinically meaningful improvement in functional abilities.   Baseline: 63/80 Goal status: INITIAL  3.  Patient will increase 30s chair stand reps from 1 to 8 without arms to demonstrate and improved functional ability with less pain/difficulty as well as reduce fall risk.  Baseline: 1 Goal status: INITIAL  4.  Patient to demonstrate independence in HEP  Baseline: [id number] Goal status: Met  5.  4+/5 L hip and knee strength Baseline: 4/5 Goal status: INITIAL  6.  Resolve lateral pull sign L Baseline: + L lateral pull sign Goal status: Improving   PLAN:  PT FREQUENCY: 1-2x/week  PT DURATION: 6 weeks  PLANNED INTERVENTIONS: 97110-Therapeutic exercises, 97530- Therapeutic activity, [id number]- Neuromuscular re-education, 97535- Self Care, [id number]- Manual therapy, [phone]- Gait training, Patient/Family education,  Balance training, Stair training, and Taping  PLAN FOR NEXT SESSION: HEP review and update, manual techniques as appropriate, aerobic tasks, ROM and flexibility activities, strengthening and PREs, TPDN, gait and balance training as needed    For all possible CPT codes, reference the Planned Interventions line above.     Check all conditions that are expected to impact treatment: {Conditions expected to impact treatment:None of these apply   If treatment provided at initial evaluation, no treatment charged due to lack of authorization.       Ciella Obi M Laylonie Marzec, PT 03/03/2024, 1:38 PM

## 2024-03-03 ENCOUNTER — Ambulatory Visit

## 2024-03-03 DIAGNOSIS — G8929 Other chronic pain: Secondary | ICD-10-CM | POA: Diagnosis not present

## 2024-03-03 DIAGNOSIS — M2242 Chondromalacia patellae, left knee: Secondary | ICD-10-CM | POA: Diagnosis not present

## 2024-03-03 DIAGNOSIS — M6281 Muscle weakness (generalized): Secondary | ICD-10-CM | POA: Diagnosis not present

## 2024-03-03 DIAGNOSIS — M25562 Pain in left knee: Secondary | ICD-10-CM | POA: Diagnosis not present

## 2024-03-22 NOTE — Therapy (Unsigned)
 OUTPATIENT PHYSICAL THERAPY TREATMENT NOTE/DISCHARGE   Patient Name: Alexandra Sutton MRN: [id number] DOB:03-20-1976, 48 y.o., female Today's Date: 03/24/2024  END OF SESSION:  PT End of Session - 03/24/24 0923     Visit Number 5    Number of Visits 6    Date for Recertification  03/22/24    Authorization Type [place] MCD    Authorization Time Period approved 5 PT visits from 01/21/24-03/08/24    Authorization - Visit Number 5    Authorization - Number of Visits 5    PT Start Time 0920    PT Stop Time 1000    PT Time Calculation (min) 40 min    Activity Tolerance Patient tolerated treatment well    Behavior During Therapy [facility] for tasks assessed/performed              Past Medical History:  Diagnosis Date   Back pain 11/2011   low and mid-back s/p pregnancy; improved with PT   Infertility, female 01/24/2011   Nonspecific chest pain 07/08/2023   S/P myomectomy 06/08/2020   SI (sacroiliac) joint dysfunction    Uterine fibroid 06/08/2020   Past Surgical History:  Procedure Laterality Date   MYOMECTOMY N/A 06/08/2020   Procedure: ABDOMINAL MYOMECTOMY;  Surgeon: Barbette Knock, MD;  Location: [facility] OR;  Service: Gynecology;  Laterality: N/A;  TAPP BLOCK   Patient Active Problem List   Diagnosis Date Noted   Nonspecific chest pain 07/08/2023   SI (sacroiliac) joint dysfunction    Uterine fibroid 06/08/2020   S/P myomectomy 06/08/2020   Back pain 11/2011   Infertility, female 01/24/2011    PCP: Obgyn, Anna   REFERRING PROVIDER: Orpha Asberry RAMAN, MD  REFERRING DIAG: [phone] (ICD-10-CM) - Chondromalacia of left knee  THERAPY DIAG:  Chondromalacia patellae of left knee  Muscle weakness (generalized)  Chronic pain of left knee  Rationale for Evaluation and Treatment: Rehabilitation  ONSET DATE: chronic  SUBJECTIVE:   SUBJECTIVE STATEMENT:  L knee overall function unchanged but feels her hip stability has improved.      PERTINENT HISTORY: None  available PAIN:  Are you having pain? Yes: NPRS scale: 6/10 Pain location: L knee Pain description: ache Aggravating factors: deep squats Relieving factors: rest  PRECAUTIONS: None  RED FLAGS: None   WEIGHT BEARING RESTRICTIONS: No  FALLS:  Has patient fallen in last 6 months? No  OCCUPATION: yoga   PLOF: Independent  PATIENT GOALS: To manage my knee pain  NEXT MD VISIT: TBD  OBJECTIVE:  Note: Objective measures were completed at Evaluation unless otherwise noted.  DIAGNOSTIC FINDINGS: none available  PATIENT SURVEYS:  LEFS 63/80  MUSCLE LENGTH: Hamstrings: Right 90 deg; Left 90 deg  POSTURE: No Significant postural limitations  PALPATION: TTP across patellar fat pad  LOWER EXTREMITY ROM: WNL  Active ROM Right eval Left eval  Hip flexion    Hip extension    Hip abduction    Hip adduction    Hip internal rotation    Hip external rotation    Knee flexion    Knee extension    Ankle dorsiflexion    Ankle plantarflexion    Ankle inversion    Ankle eversion     (Blank rows = not tested)  LOWER EXTREMITY MMT:  MMT Right eval Left eval  Hip flexion    Hip extension    Hip abduction  4  Hip adduction    Hip internal rotation    Hip external rotation    Knee flexion  Knee extension  4  Ankle dorsiflexion    Ankle plantarflexion    Ankle inversion    Ankle eversion     (Blank rows = not tested)  LOWER EXTREMITY SPECIAL TESTS:  Knee special tests: Patellafemoral apprehension test: negative, Lateral pull sign: positive , Patellafemoral grind test: negative, and Patella tap test (ballotable patella): negative  FUNCTIONAL TESTS:  30 seconds chair stand test 1; 03/24/24 14  GAIT: Distance walked: 74ftx2 Assistive device utilized: None Level of assistance: Complete Independence Comments: unremarkable                                                                                                                                TREATMENT:   [facility] Adult PT Treatment:                                                DATE: 03/24/24 Therapeutic Exercise: QS, SLR and lateral step downs Therapeutic Activity: HEP review and update, SI joint assessment, goal progress and R hip mobility and function testing.  [facility] Adult PT Treatment:                                                DATE: 03/03/24 Therapeutic Exercise: Nustep L5 8 min  Neuromuscular re-ed: Alternating knee extension from bridge position 15/15 QS 3s hold 15x FAQ w/adduction 15x Therapeutic Activity: Plank on knees 30s Plank on knees with leg extension 30s B Plank on toes 30s Plank on toes with leg extension 30s B Side planks with abduction 30s B  [facility] Adult PT Treatment:                                                DATE: 02/11/24 Therapeutic Exercise: Nustep L4 8 min 60-80 SPM Neuromuscular re-ed: Supine hip fallouts BlaTB 15x B, 15/15 S/L clams BlaTB 15/15 Bridge against BlaTB 15x Bridge w/ball 15x Therapeutic Activity: Runners step 4 in 15/15 to assess hip strength SL heel raise from 4 in step 15/15 Sidestepping against red band 2 trips   [facility] Adult PT Treatment:                                                DATE: 02/06/24 Therapeutic Exercise: Recumbent bike 8 min Neuromuscular re-ed: FAQs with adduction 15x Supine hip fallouts BluTB 15x B, 15/15 S/L clams BluTB 51/15 Therapeutic Activity: Standing TKE BluTB 15x Quad set 3s  hold SLR 2#15x SAQ 5# 15x emphasis on eccentric component L ITB Stretch 30s x2  [facility] Adult PT Treatment:                                                DATE: 01/21/24  Self Care: Additional minutes spent for educating on updated Therapeutic Home Exercise Program as well as comparing current status to condition at start of symptoms. This included exercises focusing on stretching, strengthening, with focus on eccentric aspects. Long term goals include an improvement in range of motion, strength, endurance as well as avoiding  reinjury. Patient's frequency would include in 1-2 times a day, 3-5 times a week for a duration of 6-12 weeks. Proper technique shown and discussed handout in great detail. All questions were discussed and addressed.     PATIENT EDUCATION:  Education details: Discussed eval findings, rehab rationale and POC and patient is in agreement  Person educated: Patient Education method: Explanation and Handouts Education comprehension: verbalized understanding and needs further education  HOME EXERCISE PROGRAM: Access Code: [email or web address] URL: [email or web address] Date: 02/06/2024 Prepared by: Reyes Kohut  Exercises - Supine Quad Set  - 3-5 x daily - 5 x weekly - 1 sets - 10 reps - 3s hold - Small Range Straight Leg Raise  - 3-5 x daily - 5 x weekly - 1 sets - 10 reps - 3s hold - Hooklying Single Leg Bent Knee Fallouts with Resistance  - 1 x daily - 3 x weekly - 2 sets - 15 reps - Clamshell with Resistance  - 1 x daily - 3 x weekly - 2 sets - 15 reps - Standing Terminal Knee Extension with Resistance  - 1 x daily - 3 x weekly - 2 sets - 15 reps  ASSESSMENT:  CLINICAL IMPRESSION:  Rehab goals met.  Patient has returned to all ADLs w/o difficulty.  Possible S&S of R FAI which present with yoga poses.  HEP finalized and emphasized.  SI dysfunction ruled out but R leg length discrepancy a possibility.  Patient is a 48 y.o. female who was seen today for physical therapy evaluation and treatment for L knee pain.  Patient presents with full AROM in R hip/knee, ligamentously stable throughout with TTP at anterior knee over patellar fat pad.  Positive lateral pull sign with active quad contraction, no patellar crepitus and a diminished L VMO contraction compared to R.  Patient would benefit from OPPT to minimize discomfort and improve VMO function and patellar tracking.  OBJECTIVE IMPAIRMENTS: decreased activity tolerance, decreased knowledge of condition, decreased strength, impaired perceived  functional ability, and pain.   ACTIVITY LIMITATIONS: sitting, squatting, stairs, and yoga poses  PERSONAL FACTORS: Age, Fitness, and 1 comorbidity: hypermobility are also affecting patient's functional outcome.   REHAB POTENTIAL: Good  CLINICAL DECISION MAKING: Stable/uncomplicated  EVALUATION COMPLEXITY: Low   GOALS: Goals reviewed with patient? No    SHORT TERM GOALS=LONG TERM GOALS: Target date: 03/22/24  Patient will acknowledge 4/10 pain at least once during episode of care   Baseline: 6/10; 03/24/24 2/10 Goal status: Met  2.  Patient will score at least 73/80 on LEFS to signify clinically meaningful improvement in functional abilities.   Baseline: 63/80; 03/24/24 65/80 Goal status: INITIAL  3.  Patient will increase 30s chair stand reps from 1 to 8 without arms to demonstrate and improved functional ability with  less pain/difficulty as well as reduce fall risk.  Baseline: 1; 03/24/24 14 Goal status: Met  4.  Patient to demonstrate independence in HEP  Baseline: [id number] Goal status: Met  5.  4+/5 L hip and knee strength Baseline: see 30s chair stand test Goal status: Met  6.  Resolve lateral pull sign L Baseline: + L lateral pull sign Goal status: Partially met   PLAN:  PT FREQUENCY: 1-2x/week  PT DURATION: 6 weeks  PLANNED INTERVENTIONS: 97110-Therapeutic exercises, 97530- Therapeutic activity, [id number]- Neuromuscular re-education, 97535- Self Care, 02859- Manual therapy, [phone]- Gait training, Patient/Family education, Balance training, Stair training, and Taping  PLAN FOR NEXT SESSION: HEP review and update, manual techniques as appropriate, aerobic tasks, ROM and flexibility activities, strengthening and PREs, TPDN, gait and balance training as needed    For all possible CPT codes, reference the Planned Interventions line above.     Check all conditions that are expected to impact treatment: {Conditions expected to impact treatment:None of these  apply   If treatment provided at initial evaluation, no treatment charged due to lack of authorization.       Marquail Bradwell M Carole Deere, PT 03/24/2024, 10:14 AM

## 2024-03-24 ENCOUNTER — Ambulatory Visit

## 2024-03-24 DIAGNOSIS — M6281 Muscle weakness (generalized): Secondary | ICD-10-CM | POA: Diagnosis present

## 2024-03-24 DIAGNOSIS — G8929 Other chronic pain: Secondary | ICD-10-CM | POA: Diagnosis present

## 2024-03-24 DIAGNOSIS — M2242 Chondromalacia patellae, left knee: Secondary | ICD-10-CM | POA: Insufficient documentation

## 2024-03-24 DIAGNOSIS — M25562 Pain in left knee: Secondary | ICD-10-CM | POA: Diagnosis present
